# Patient Record
Sex: Female | Born: 1940 | Race: White | Hispanic: No | Marital: Married | Smoking: Current some day smoker
Health system: Southern US, Community
[De-identification: ages and names within clinical notes are randomized; demographics above are authoritative.]

## PROBLEM LIST (undated history)

## (undated) DIAGNOSIS — I2510 Atherosclerotic heart disease of native coronary artery without angina pectoris: Secondary | ICD-10-CM

## (undated) DIAGNOSIS — C801 Malignant (primary) neoplasm, unspecified: Secondary | ICD-10-CM

## (undated) DIAGNOSIS — I219 Acute myocardial infarction, unspecified: Secondary | ICD-10-CM

## (undated) DIAGNOSIS — I10 Essential (primary) hypertension: Secondary | ICD-10-CM

## (undated) DIAGNOSIS — J069 Acute upper respiratory infection, unspecified: Secondary | ICD-10-CM

## (undated) DIAGNOSIS — D649 Anemia, unspecified: Secondary | ICD-10-CM

## (undated) DIAGNOSIS — R011 Cardiac murmur, unspecified: Secondary | ICD-10-CM

## (undated) DIAGNOSIS — I6529 Occlusion and stenosis of unspecified carotid artery: Secondary | ICD-10-CM

## (undated) HISTORY — DX: Anemia, unspecified: D64.9

## (undated) HISTORY — DX: Occlusion and stenosis of unspecified carotid artery: I65.29

## (undated) HISTORY — DX: Atherosclerotic heart disease of native coronary artery without angina pectoris: I25.10

## (undated) HISTORY — PX: CAROTID ENDARTERECTOMY: SUR193

## (undated) HISTORY — PX: EYE SURGERY: SHX253

## (undated) HISTORY — DX: Acute myocardial infarction, unspecified: I21.9

## (undated) HISTORY — DX: Malignant (primary) neoplasm, unspecified: C80.1

---

## 1984-06-19 HISTORY — PX: ABDOMINAL HYSTERECTOMY: SHX81

## 1988-06-19 HISTORY — PX: FRACTURE SURGERY: SHX138

## 2000-06-19 HISTORY — PX: JOINT REPLACEMENT: SHX530

## 2005-06-19 DIAGNOSIS — I219 Acute myocardial infarction, unspecified: Secondary | ICD-10-CM

## 2005-06-19 HISTORY — PX: OTHER SURGICAL HISTORY: SHX169

## 2005-06-19 HISTORY — DX: Acute myocardial infarction, unspecified: I21.9

## 2005-09-15 ENCOUNTER — Inpatient Hospital Stay (HOSPITAL_COMMUNITY): Admission: EM | Admit: 2005-09-15 | Discharge: 2005-09-21 | Payer: Self-pay

## 2005-10-31 ENCOUNTER — Ambulatory Visit (HOSPITAL_COMMUNITY): Admission: RE | Admit: 2005-10-31 | Discharge: 2005-11-01 | Payer: Self-pay | Admitting: Cardiology

## 2006-01-22 ENCOUNTER — Ambulatory Visit (HOSPITAL_COMMUNITY): Admission: RE | Admit: 2006-01-22 | Discharge: 2006-01-22 | Payer: Self-pay | Admitting: Cardiology

## 2006-03-01 ENCOUNTER — Inpatient Hospital Stay (HOSPITAL_BASED_OUTPATIENT_CLINIC_OR_DEPARTMENT_OTHER): Admission: RE | Admit: 2006-03-01 | Discharge: 2006-03-01 | Payer: Self-pay | Admitting: Cardiology

## 2006-09-04 ENCOUNTER — Encounter (HOSPITAL_COMMUNITY): Admission: RE | Admit: 2006-09-04 | Discharge: 2006-09-04 | Payer: Self-pay | Admitting: Cardiology

## 2006-09-20 ENCOUNTER — Inpatient Hospital Stay (HOSPITAL_BASED_OUTPATIENT_CLINIC_OR_DEPARTMENT_OTHER): Admission: RE | Admit: 2006-09-20 | Discharge: 2006-09-20 | Payer: Self-pay | Admitting: Cardiology

## 2006-10-11 ENCOUNTER — Ambulatory Visit (HOSPITAL_COMMUNITY): Admission: RE | Admit: 2006-10-11 | Discharge: 2006-10-12 | Payer: Self-pay | Admitting: Cardiology

## 2006-10-24 ENCOUNTER — Encounter: Admission: RE | Admit: 2006-10-24 | Discharge: 2006-10-24 | Payer: Self-pay | Admitting: Family Medicine

## 2008-10-12 ENCOUNTER — Encounter (HOSPITAL_COMMUNITY): Admission: RE | Admit: 2008-10-12 | Discharge: 2009-01-10 | Payer: Self-pay | Admitting: Cardiology

## 2009-11-19 ENCOUNTER — Encounter (HOSPITAL_COMMUNITY): Admission: RE | Admit: 2009-11-19 | Discharge: 2010-01-18 | Payer: Self-pay | Admitting: Cardiology

## 2010-11-04 NOTE — Cardiovascular Report (Signed)
NAMECABRINI, RUGGIERI                ACCOUNT NO.:  [id number]   MEDICAL RECORD NO.:  [id number]          PATIENT TYPE:  OIB   LOCATION:  1966                         FACILITY:  [facility]   PHYSICIAN:  Mohan N. Sharyn Lull, M.D. DATE OF BIRTH:  November 24, 1940   DATE OF PROCEDURE:  09/20/2006  DATE OF DISCHARGE:                            CARDIAC CATHETERIZATION   PROCEDURE:  Left cardiac cath with selective left and right coronary  angiography, LV graft via right groin using Judkins technique.   INDICATIONS FOR PROCEDURE:  Ms. [name] is a 70 year old white  female with past medical history significant for posterolateral wall MI  in March 2007.  Subsequently, had emergency PTCA stenting to the left  circumflex.  Was noted to have 2-vessel disease, status post PCI to RCA  in May 2007, hypertension, hypercholesteremia, history of nonsustained  VT, non-insulin-dependent diabetes mellitus, tobacco abuse, degenerative  joint disease, positive family history of coronary artery disease.  Complains of exertional chest pain radiating to the neck without  associated nausea, vomiting or diaphoresis.  Denies shortness of breath.  Denies palpitation, lightheadedness or syncope.  Patient underwent  Persantine Myoview on March 18 which showed moderate-sized reversible  ischemia in the lateral wall with EF of 50%.   PAST MEDICAL HISTORY:  As above.   PAST SURGICAL HISTORY:  She had bilateral hip replacement in the past;  had right ankle surgery in the past.   NO KNOWN DRUG ALLERGIES.   SOCIAL HISTORY:  She is married, smoked 1-1/2 to 2 packs per day for 40+  years.  Now smokes less than half a pack per day and is planning to  quit.  No history of alcohol abuse.  She is retired.   FAMILY HISTORY:  Positive for coronary artery disease.   MEDICATIONS:  At home, she is on enteric coated aspirin 81 mg p.o.  daily, Plavix 75 mg p.o. daily, Toprol 100 mg p.o. daily, Altace 10 mg  p.o. daily, Azor 5/20 p.o.  daily, Imdur 60 mg p.o. daily, Lipitor 40 mg  p.o. daily, Protonix 40 mg p.o. daily.   GENERAL:  On examination she is alert, awake, oriented x3, in no acute  distress.  VITAL SIGNS:  Blood pressure is 160/82, pulse was 72 regular.  HEENT:  Conjunctivae were pink.  NECK:  Supple.  No JVD, no bruit.  LUNGS:  Clear to auscultation without rhonchi or rales.  CARDIOVASCULAR EXAM:  S1, S2 were normal.  There was a soft systolic  murmur.  ABDOMEN:  Soft.  Bowel sounds are present, nontender.  EXTREMITIES:  There is no clubbing, cyanosis or edema.   IMPRESSION:  New onset angina; positive Persantine Myoview, rule out  restenosis versus progression of the disease; coronary artery disease;  history of posterolateral wall myocardial infarction, status post  percutaneous coronary intervention to left circumflex, obtuse marginal  one and right coronary artery in the past as above; hypertension;  hypercholesteremia; chronic obstructive pulmonary disease; tobacco  abuse; positive family history of coronary artery disease.  Discussed  with the patient regarding Persantine Myoview result and left cath; its  risks and  benefits i.e. death, myocardial infarction, stroke, local  vascular complications, etcetera and consented for the procedure.   PROCEDURE:  After obtaining the informed consent, patient was brought to  the cath lab and was placed on fluoroscopy table.  The right groin was  prepped and draped in the usual fashion.  2% Xylocaine was used for  local anesthesia in the right groin.  With the help of thin-walled  needle, 4-French arterial sheath was placed.  The sheath was aspirated  and flushed.  Next, 4-French left Judkins catheter was advanced over the  wire under fluoroscopic guidance up to the ascending aorta.  Wire was  pulled out, the catheter was aspirated and connected to the manifold.  Catheter was further advanced and engaged into left coronary ostium.  Multiple views of the left  system were taken.  Next, the catheter was  disengaged and was pulled out over the wire and was replaced with 4-  [place] right Judkins catheter which was advanced over the wire under  fluoroscopic guidance into the ascending aorta.  Wire was pulled out.  The catheter was aspirated and connected to the manifold.  Catheter was  further advanced and engaged into the right coronary ostium.  Multiple  views of the right system were taken.  Next, catheter was disengaged and  was pulled out over the wire and was replaced with 6-French pigtail  catheter which was advanced over the wire under fluoroscopic guidance up  to the ascending aorta.  Wire was pulled out the catheter was aspirated  and connected to the manifold.  Catheter was further advanced across the  aortic valve into the LV.  LV pressures were recorded.  Next, LV graft  was done in 30 degrees RAO position.  Post angiographic pressures were  recorded from LV and then pullback pressures were recorded from the  aorta.  There was no gradient across the aortic valve.  Next, the  pigtail catheter was pulled out over the wire.  Sheaths were aspirated  and flushed.   FINDINGS:  LV showed good LV systolic function, EF of 50-55%.  Left main  was short which was patent.  LAD has 10-15% proximal and 20-25% mid  stenosis.  Diagonal one has 30-40% ostial stenosis.  Left circumflex has  proximal 75-80% focal stenosis at the distal edge of the stent.  OM1 was  moderate size which has ostial 30-40% stenosis which appears to have  hypolucency at the ostium, but TIMI grade 3 distal flow.  OM1 branches  into superior smaller branch and larger inferior branch, superior branch  has 40-50% ostial stenosis.  OM2 was very small which was patent.  RCA  has 30-40% proximal stenosis.  Stented mid portion is patent.  PDA is  small which is patent.  The LV branches are very small and diffusely diseased.  The patient tolerated procedure well.  There were no   complications.  The patient was transferred to recovery room in stable  condition.  The patient will be scheduled for PCI to left circumflex  early next week.           ______________________________  Eduardo Osier. Sharyn Lull, M.D.     M[facility]/MEDQ  D:  09/20/2006  T:  09/20/2006  Job:  [id number]   cc:   Cath Lab

## 2010-11-04 NOTE — Cardiovascular Report (Signed)
NAMESHARVI, MOONEYHAN                ACCOUNT NO.:  [id number]   MEDICAL RECORD NO.:  [id number]          PATIENT TYPE:  OIB   LOCATION:  6522                         FACILITY:  [facility]   PHYSICIAN:  Mohan N. [name], M.D. DATE OF BIRTH:  1940/09/18   DATE OF PROCEDURE:  10/11/2006  DATE OF DISCHARGE:                            CARDIAC CATHETERIZATION   PROCEDURE:  Successful PTCA to proximal left circumflex done using 3.0 x  8-mm long Voyager balloon.   INDICATIONS FOR PROCEDURE:  [name] is a 70 year old white female  with past medical history significant for coronary artery disease,  history of posterolateral wall MI in March 2007, status post emergency  PCI to left circumflex, status post PTCA stenting to RCA in May 2007,  hypertension, hypercholesteremia, history of nonsustained VT, non-  insulin-dependent diabetes mellitus controlled by diet, tobacco abuse,  degenerative joint disease, positive family history of coronary artery  disease.  Complains of exertional chest pain radiating to the neck  without associated symptoms of nausea, vomiting or diaphoresis.  Denies  any shortness of breath.  Denies palpitation, lightheadedness or  syncope.  The patient underwent Persantine Myoview on March 18 which  showed moderate size reversible ischemia in the lateral wall with EF of  50%.  The patient subsequently underwent left cath as outpatient which  showed tight stenosis at the distal edge of the stent in proximal left  circumflex.  The patient is scheduled for elective PCI to left  circumflex.   PROCEDURE:  After obtaining informed consent, the patient was brought to  the cath lab and was placed on fluoroscopy table.  The right groin was  prepped and draped in usual fashion, and 2% Xylocaine was used for local  anesthesia in the right groin.  With the help of thin-wall needle, a 6-  French arterial sheath was placed.  The sheath was aspirated and  flushed.  Next, a 6-French left  Judkins catheter was advanced over the  wire under fluoroscopic guidance up to the ascending aorta.  Wire was  pulled out.  The catheter was aspirated and connected to the manifold.  A 6-French 3.5 XB guiding catheter was advanced over the wire under  fluoroscopic guidance up to the ascending aorta.  Wire was pulled out.  The catheter was aspirated and connected to the manifold.  Catheter was  further advanced and engaged into left coronary ostium.  Multiple views  of the left system were obtained.   FINDINGS:  Again, proximal circumflex at the distal edge of the stent  showed focal 75-80% stenosis.   INTERVENTIONAL PROCEDURE:  Successful PTCA to proximal left circumflex  was done using 3.0 x 8-mm long Voyager balloon.  Two inflations were  done going up to 11 atmospheres of pressure.  Lesion was dilated from 35  to 80% less, and 10% __________ with excellent TIMI grade 3 distal flow  without evidence of dissection or distal embolization.  The patient  received weight-based Angiomax and 300 mg of Plavix during the  procedure.  The patient tolerated procedure well.  There were no  complications.  The patient  was transferred to recovery room in stable  condition.           ______________________________  Eduardo Osier [name], M.D.    MNH/MEDQ  D:  10/12/2006  T:  10/12/2006  Job:  [id number]   cc:   Catheterization Laboratory

## 2010-11-04 NOTE — Discharge Summary (Signed)
NAMELEASA, KINCANNON                ACCOUNT NO.:  [id number]   MEDICAL RECORD NO.:  [id number]          PATIENT TYPE:  OIB   LOCATION:  6522                         FACILITY:  [facility]   PHYSICIAN:  Mohan N. Sharyn Lull, M.D. DATE OF BIRTH:  Mar 11, 1941   DATE OF ADMISSION:  10/11/2006  DATE OF DISCHARGE:  10/12/2006                               DISCHARGE SUMMARY   ADMITTING DIAGNOSES:  1. New-onset angina.  2. Positive Persantine Myoview status post left cath.  3. CAD.  4. History of posterolateral wall MI in the past.  5. Multivessel coronary artery disease.  6. Hypertension.  7. Hypercholesteremia.  8. COPD.  9. Tobacco abuse.  10.Positive family history of coronary artery disease.   FINAL DIAGNOSES:  1. New onset angina status post PTCA to proximal left circumflex.  2. Coronary artery disease.  3. History of posterolateral wall MI.  4. Status post PCI to left circumflex OM in past.  5. Multivessel coronary artery disease status post PTCA stenting to      RCA in the past.  6. Hypertension.  7. Hypercholesteremia.  8. COPD/bronchitis.  9. Tobacco abuse.  10.Positive family history of coronary artery disease   DISCHARGE MEDICATIONS:  1. Enteric-coated aspirin 325 mg one tablet daily.  2. Plavix 75 mg one tablet daily with food.  3. Toprol XL 50 mg one tablet daily.  4. Altace 10 mg one capsule daily.  5. Azor 5/20 one tablet daily.  6. Lipitor 40 mg one tablet daily.  7. Protonix 40 mg one tablet daily.  8. Nitrostat 0.4 mg sublingual used as directed.  9. Z-Pak 250 mg 2 tablets today, then one tablet daily for four more      days.  10.Chantix 1 mg half tablet daily for 4 days, then half tablet twice      daily for 4 days, then one tablet twice daily.   DISCHARGE INSTRUCTIONS:  1. Diet:  Low salt, low cholesterol.  At this time, the patient has      been advised to avoid sweets.  2. Post PTCA and stent instructions have been given.  3. The patient has been advised to  avoid lifting, driving or sexual      activity for one week.   CONDITION AT DISCHARGE:  Stable.   BRIEF HISTORY AND HOSPITAL COURSE:  [name] is a 70 year old white  female with past medical history significant for coronary artery  disease, history of posterolateral wall MI in March 2007, status post  emergent PCI to left circumflex, status post PCI to RCA in __________ ,  hypertension, hypercholesterolemia, history of nonsustained VT, non-  insulin-dependent diabetes mellitus controlled by diet, tobacco abuse,  degenerative joint disease, positive family history of coronary artery  disease.  Complains of exertional chest pain radiating to the neck  without associated nausea, vomiting or diaphoresis.  Denies any  shortness of breath.  Denies palpitation, lightheadedness or syncope.  The patient underwent Persantine Myoview on September 04, 2006, which showed  moderate size reversible ischemia in the lateral wall with EF of 50%.  The patient subsequently had left  cath which showed tight stenosis in  the distal edge of the stent and left circumflex.  The patient is  admitted here for elective PCI to left circumflex.   PAST MEDICAL HISTORY:  As above.   SURGICAL HISTORY:  1. She has bilateral hip replacement in the past.  2. Had right ankle surgery in the past.   ALLERGIES:  NO KNOWN DRUG ALLERGIES   SOCIAL HISTORY:  She is married, smoked one and a half to two packs per  day for 40 years and now has a half pack per day.  No history of alcohol  abuse.  She is retired.  The patient intends to stop smoking now.   FAMILY HISTORY:  Positive for coronary artery disease.   MEDICATION AT HOME:  1. Enteric-coated aspirin 81 mg p.o. daily.  2. Plavix 75 mg p.o. daily.  3. Toprol XL 100 mg p.o. daily.  4. Altace 10 mg p.o. daily.  5. Azor 5/20 p.o. daily.  6. Imdur 60 p.o. daily.  7. Lipitor 40 mg p.o. daily.  8. Protonix 40 mg p.o. daily .   PHYSICAL EXAMINATION:  GENERAL:  She is  alert and oriented x3 in no  acute distress.  '  VITAL SIGNS:  Blood pressure was 160/82, pulse was 72 regular.  HEENT:  Conjunctivae was pink.  NECK:  Supple, no JVD, no bruits.  LUNGS:  Clear to auscultation without rhonchi or rales.  CARDIOVASCULAR:  S1, S2 normal.  No S3 gallop.  ABDOMEN:  Soft.  Bowel sounds were present, nontender.  EXTREMITIES:  There is no clubbing, cyanosis or edema.   BRIEF HOSPITAL COURSE:  The patient was a.m. admit and underwent PTCA to  left supple proximal left circumflex as per procedure report.  The  patient tolerated procedure well.  There are no complications.  Postprocedure, the patient did not have any chest pain during the  hospital stay.  The labs remained stable.  CPK was  48, MB 1.8.  Her renal function is stable.  The patient has been placed  on cardiac rehab as well.  The patient has been ambulating in hallway  without any problems.  Groin is stable with no evidence of hematoma or  bruit.  The patient will be discharged home on above medications and  will be followed up in my office in one week.           ______________________________  Eduardo Osier. Sharyn Lull, M.D.     M[facility]/MEDQ  D:  10/12/2006  T:  10/12/2006  Job:  [id number]

## 2010-11-04 NOTE — Cardiovascular Report (Signed)
NAMEMARGART, Mcclure                ACCOUNT NO.:  [id number]   MEDICAL RECORD NO.:  [id number]          PATIENT TYPE:  OIB   LOCATION:  6526                         FACILITY:  [facility]   PHYSICIAN:  [name], M.D. DATE OF BIRTH:  December 26, 1940   DATE OF PROCEDURE:  10/31/2005  DATE OF DISCHARGE:  11/01/2005                              CARDIAC CATHETERIZATION   PROCEDURE:  1.  Left cardiac cath with selective left and right coronary angiography via      right groin using Judkins technique.  2.  Successful percutaneous transluminal coronary angiography to mid and      distal junction of right coronary artery using 2.5 x 12-mm long Voyager      balloon.  3.  Successful deployment of 2.75 x 33-mm long Cypher drug-eluting stent in      mid and distal junction of right coronary artery.  4.  Successful post dilatation of this stent using 2.75 x 13-mm long      PowerSail balloon.   INDICATIONS FOR PROCEDURE:  [name] is a 70 year old white female with  past medical history significant for coronary artery disease status post  posterolateral wall myocardial infarction in March 2007, two-vessel coronary  artery disease.  She had PCI to the left circumflex in March 2007 when she  presented with posterolateral wall MI, hypertension, hypercholesteremia,  tobacco abuse, complains of retrosternal chest pressure and tightness  lasting 2-3 minutes relieved with rest without associated symptoms of  nausea, vomiting or diaphoresis.  She states her activity is limited post  myocardial infarction.  She denies any palpitation, light headedness or  syncope.  Denies PND, orthopnea or leg swelling.  Denies cough, fever or  chills.  The patient was noted to have sequential mid and distal stenosis in  the range of 60-80% in the RCA when she had posterolateral wall MI requiring  PCI to the left circumflex system.  The patient is admitted for left cath  for re-look angiography and possibly PTCA and  stenting to RCA.   PROCEDURE:  After obtaining informed consent, the patient was brought to the  cath lab and was placed on the fluoroscopy table.  The right groin was  prepped and draped in usual fashion.  2% Xylocaine was used for local  anesthesia in the right groin. With the help of thin-wall needle, a 7-French  arterial sheath was placed.  The sheath was aspirated and flushed.  Next, 7-  French left Judkins catheter was advanced over the wire under fluoroscopic  guidance up to the ascending aorta.  The wire was pulled out, the catheter  was aspirated, and connected to the manifold.  The catheter was further  advanced and engaged into the left coronary ostium.  Multiple views of the  left system were obtained.  Next, the catheter was disengaged and was pulled  out over the wire and was replaced with 7-French JR-4 guiding catheter which  was advanced over the wire under fluoroscopic guidance into the ascending  aorta.  The wire was pulled out, the catheter was aspirated, and connected  to the manifold.  The catheter was further advanced and engaged into right  coronary ostium.  Multiple views of the right system were taken.   FINDINGS:  LV was not done.  Left main was short which was patent.  Left  circumflex was patent at prior PTCA stented site.  LAD has mild disease as  before.  RCA has 20-30% proximal stenosis, 85% mid stenosis, and 70% mid and  distal junction stenosis and 50-60% distal stenosis.   INTERVENTIONAL PROCEDURE:  Successful PTCA to mid and distal junction of the  RCA was done using 2.5 x 12-mm long Voyager balloon for predilatation and  then 2.75 x 33-mm long Cypher drug-eluting stent was deployed in mid and  distal junction of RCA at 13 atmospheres pressure, the stent was postdilated  using 2.75 x 13-mm long PowerSail balloon going up to 18 atmospheres of  pressure.  The lesions were dilated from 70-85% to 0% with  excellent TIMI grade 3 distal flow without evidence  of dissection or distal  embolization.  The patient received weight-based Angiomax and 300 mg of  Plavix prior to the procedure.  The patient tolerated procedure well.  There  are no complications.  The patient was transferred to the recovery room in  stable condition.  Oct 31, 2005           ______________________________  Eduardo Osier. Sharyn Mcclure, M.D.     M[facility]/MEDQ  D:  11/01/2005  T:  11/01/2005  Job:  [id number]

## 2010-11-04 NOTE — Discharge Summary (Signed)
[name], [name]                ACCOUNT NO.:  [id number]   MEDICAL RECORD NO.:  [id number]          PATIENT TYPE:  INP   LOCATION:  2011                         FACILITY:  [facility]   PHYSICIAN:  Eduardo Osier. Sharyn Lull, M.D. DATE OF BIRTH:  Aug 24, 1940   DATE OF ADMISSION:  09/15/2005  DATE OF DISCHARGE:  09/21/2005                                 DISCHARGE SUMMARY   ADMITTING DIAGNOSES:  1.  Acute posterolateral wall myocardial infarction status post ventricular      fibrillation, cardiac arrest in emergency room.  2.  Hypertension.  3.  Tobacco abuse.  4.  Positive family history of coronary artery disease.  5.  Chronic anemia.  6.  Degenerative joint disease.   DISCHARGE DIAGNOSES:  1.  Status post acute posterolateral wall myocardial infarction.  2.  Status post ventricular fibrillation cardiac arrest in emergency room.  3.  Status post emergency PTCA and stenting to left circumflex as per      procedure report.  4.  Two vessel coronary artery disease status post non-sustained ventricular      tachycardia and supraventricular tachycardia.  5.  Hypertension.  6.  Non-insulin-dependent diabetes mellitus controlled by diet.  7.  Hypercholesterolemia.  8.  Status post acute on chronic anemia.  9.  History of tobacco abuse.  10. Positive family history of coronary artery disease.  11. Status post bronchitis.  12. Degenerative joint disease.   DISCHARGE HOME MEDICATIONS:  1.  Enteric-coated aspirin 81 mg two tablets daily.  2.  Plavix 75 mg one tablet daily with food.  3.  Toprol XL 50 mg one tablet daily.  4.  Altace 2.5 mg one capsule daily.  5.  Lipitor 20 mg one tablet daily.  6.  Protonix 40 mg one tablet daily.  7.  Nitrostat 0.4 mg sublingual use as directed.  8.  Feosol 325 mg one tablet daily.   DIET:  Low salt, low cholesterol.  Patient has been advised to avoid sweets,  increase activity slowly as tolerated.  Post PTCA/stent instructions have  been given.  Patient has  been advised to avoid any lifting, pushing, or  pulling.   Follow up with me in one week.   CONDITION ON DISCHARGE:  Stable.   BRIEF HISTORY AND HOSPITAL COURSE:  [name] is a 70 year old white female  with past medical history significant for hypertension, tobacco abuse,  degenerative joint disease, strong family history of coronary artery  disease.  She came to the ER via EMS complaining of retrosternal chest pain  off and on yesterday evening and around 12:30 a.m. developed severe chest  pain associated with nausea, vomiting, and diaphoresis.  She called EMS and  was brought to the ER.  EKs done in the ER showed atrial fibrillation with  controlled ventricular response with marked ST depression in V1-V4 and ST  elevation in V5 and V6 suggestive of posterolateral wall injury pattern.  Patient went into ventricular fibrillation arrest in the ER requiring CPR  and defibrillation with conversion to sinus rhythm.  States that similar  episode of chest pain lasting few  minutes approximately two months ago and  had stress test which was normal.   PAST MEDICAL HISTORY:  As above.   PAST SURGICAL HISTORY:  She had bilateral hip surgery in the past and right  ankle surgery in the past.   ALLERGIES:  No known drug allergies.   HOME MEDICATIONS:  She was on questionable blood pressure medications.   SOCIAL HISTORY:  She is married.  Smoke one and a half packs to two packs  per day for 40+ years.  No history of alcohol abuse.  She is retired.   FAMILY HISTORY:  Positive for coronary artery disease.   PHYSICAL EXAMINATION:  GENERAL:  She is alert, awake, oriented x3 in no  acute distress complaining of severe chest pain.  Hemodynamically was  stable.  HEENT:  Conjunctiva was pink.  NECK:  Supple.  No JVD.  LUNGS:  Clear to auscultation without rhonchi or rales.  CARDIOVASCULAR:  S1, S2 was normal.  There was soft systolic murmur and S4  gallop.  ABDOMEN:  Soft.  Bowel sounds  present, nontender.  EXTREMITIES:  There is no clubbing, cyanosis, edema.   LABORATORIES:  Her cholesterol was 272, LDL 174, triglycerides 329.  Her CK  was 75, MB 3.5.  Second set CK 4918, MB 376.  Third set CK 3451, MB 256.  Fourth set CK 2614, MB 139.  Relative index 5.3.  Fourth set CK 206, MB 4.3.  A troponin I was 0.18, more than 100, then 26.52.  Repeat troponin I today  is 8.53.  Her CK is 125, MB 3.3.  Her sodium was 133, potassium 3.2,  chloride 102, bicarbonate 22, glucose 145, BUN 21, creatinine 1.2.  Today's  electrolyte is BUN is 10, creatinine 0.8, potassium is 4.1.  Hemoglobin A1c  was 5.7.  Her admission hemoglobin was 11.4, hematocrit 34, white count of  18.  On March 31 hemoglobin dropped to 9.4, hematocrit 27.8, white count of  9.4.  Patient did receive 2 units of packed rbc's and repeat hemoglobin on  April 4 is 11.6, hematocrit 33.5, white count of 8.2.  Today hemoglobin is  8.5, hematocrit 32.9, white count of 6.9 which has been stable.   BRIEF HISTORY AND HOSPITAL COURSE:  Patient was directly taken to the  catheterization laboratory and underwent emergency left catheterization and  PTCA stenting to left circumflex, 100% occluded circumflex.  Patient  tolerated procedure well.  There were no complications.  Patient was  transferred to CCU in stable condition.  Patient did not have any episodes  of further chest pain during the hospital stay.  Patient's groin is stable  with no evidence of hematoma or bruit.  Patient has been ambulating in  hallway without any problems.  Patient had episode of nonsustained SVT.  Her  beta blockers were increased.  Patient did not have any further episodes of  SVT during the hospital stay.  Patient had cough with mild yellowish  productive cough.  Was started on Augmentin with  improvement in her symptoms.  Patient remained afebrile during the hospital stay.  Patient will be discharged home on above medications and will be  followed  up in my office in one week.  Patient will require PCI to RCA which  we will schedule in a few weeks.           ______________________________  Eduardo Osier Sharyn Lull, M.D.     MNH/MEDQ  D:  09/21/2005  T:  09/22/2005  Job:  [id number]

## 2010-11-04 NOTE — Discharge Summary (Signed)
NAMESUANN, KLIER                ACCOUNT NO.:  [id number]   MEDICAL RECORD NO.:  [id number]          PATIENT TYPE:  OIB   LOCATION:  6526                         FACILITY:  [facility]   PHYSICIAN:  Mohan N. Sharyn Lull, M.D. DATE OF BIRTH:  Aug 12, 1940   DATE OF ADMISSION:  10/31/2005  DATE OF DISCHARGE:  11/01/2005                                 DISCHARGE SUMMARY   ADMITTING DIAGNOSES:  1.  New-onset angina.  2.  Coronary artery disease status post posterolateral wall myocardial      infarction in March 2007.  Two-vessel coronary artery disease.  3.  Hypertension.  4.  Hypercholesteremia.  5.  Tobacco abuse.  6.  Positive family history of coronary artery disease.   DISCHARGE DIAGNOSES:  1.  New onset angina status post percutaneous transluminal coronary      angioplasty stenting to right coronary artery.  2.  Coronary artery disease status post posterolateral wall myocardial      infarction in March 2007.  Two-vessel coronary artery disease.  3.  Hypertension.  4.  Hypercholesteremia.  5.  Anemia.  6.  Tobacco abuse.  7.  Positive family history of coronary artery disease.   DISCHARGE HOME MEDICATIONS:  1.  Enteric-coated aspirin 81 mg two tablets daily.  2.  Plavix 75 mg one tablet daily with food.  3.  Toprol XL 50 mg one tablet daily.  4.  Altace 5 mg one capsule daily.  5.  Vytorin 10/40 one tablet daily.  6.  Protonix 40 mg one tablet daily.  7.  Feosol one tablet daily.  8.  Nitrostat 0.4 mg sublingual use as directed.   RESTRICTIONS:  Diet low salt, low cholesterol.  Post PTCA stent instructions  have been given.  The patient has been advised to avoid any lifting, pushing  or pulling for 48 hours.  Follow up with me in 1 week.  The patient is  scheduled for phase II cardiac rehab as outpatient.  Condition at discharge  is stable.   BRIEF HISTORY AND HOSPITAL COURSE:  Ms. Imel is a 70 year old white female  with past medical history significant for coronary artery  disease status  post posterolateral wall myocardial infarction, two-vessel coronary artery  disease status post PCI to left circumflex in March 2007, hypertension and  hypercholesteremia complains of retrosternal chest pressure/tightness  lasting 2-3 minutes relieved with rest without associated symptoms of  nausea, vomiting or diaphoresis.  States her activity level is limited post  MI.  Denies any palpitation, lightheadedness or syncope.  Denies PND,  orthopnea, leg swelling.  Denies cough, fever or chills.  The patient was  noted to have sequential mid RCA lesion in the range of 60-80%.  The patient  was admitted for PCI to RCA.   PAST MEDICAL HISTORY:  As above.   PAST SURGICAL HISTORY:  She had bilateral hip replacement in the past status  post right ankle surgery in the past.   ALLERGIES:  NO KNOWN DRUG ALLERGIES.   SOCIAL HISTORY:  She is married.  Smoked one and a half to two packs per day  for 40+ years.  No history of alcohol abuse.  She is retired.   FAMILY HISTORY:  Positive for coronary artery disease.   MEDICATIONS AT HOME:  She was on enteric-coated aspirin 81 mg p.o. daily,  Plavix 75 mg p.o. daily, Toprol 50 p.o. daily, Altace 5 p.o. daily, Vytorin  10/40 p.o. daily, Protonix 40 p.o. daily, Feosol 325 mg p.o. daily.   EXAMINATION:  GENERAL:  She was alert, awake, oriented x3 in no acute  distress.  VITAL SIGNS:  Blood pressure was 150/80, pulse was 70 regular.  HEENT:  Conjunctivae pink.  NECK:  Supple, no JVD, no bruit.  LUNGS:  Clear to auscultation without rhonchi or rales.  CARDIOVASCULAR EXAM:  S1-S2 was normal.  There was soft systolic murmur.  EXTREMITIES:  There is no clubbing, cyanosis or edema.   BRIEF HOSPITAL COURSE:  The patient was AM admit and underwent left cardiac  cath with selective left and right coronary angiography and PTCA and  stenting to RCA as per procedure report.  The patient tolerated procedure  well.  There were no complications.   Phase I cardiac rehab was called.  The  patient has been ambulating in hallway without any problems.  Her groin is  stable.  There is no evidence of hematoma or bruit.  Postprocedure her EKG  has been stable and her labs postprocedure have been stable including CPK-  MB.  The patient did not have any episodes of chest pain during the hospital  stay.  The patient will be discharged home on above medications and will be  followed up in my office in 1 week.           ______________________________  Eduardo Osier. Sharyn Lull, M.D.     MNH/MEDQ  D:  11/01/2005  T:  11/01/2005  Job:  [id number]

## 2010-11-04 NOTE — Cardiovascular Report (Signed)
NAMELORIANN, [name]                ACCOUNT NO.:  [id number]   MEDICAL RECORD NO.:  [id number]          PATIENT TYPE:  INP   LOCATION:  1829                         FACILITY:  [facility]   PHYSICIAN:  Mohan N. Sharyn Lull, M.D. DATE OF BIRTH:  [birth year]   DATE OF PROCEDURE:  09/15/2005  DATE OF DISCHARGE:                              CARDIAC CATHETERIZATION   PROCEDURE:  1.  Left cardiac cath with selective left and right coronary angiograph,      left ventriculogram, via the right groin using Judkins technique.  2.  Successful percutaneous transluminal coronary angiography to 100%      occluded proximal left circumflex using 3 x 8-mm long Voyager balloon.  3.  Successful deployment of 3 x 18-mm long Cypher drug eluting stent in      proximal and mid junction of left circumflex.   INDICATIONS FOR PROCEDURE:  [name] is a 70 year old white female with  past medical history significant for hypertension, tobacco abuse,  degenerative joint disease, strong family history of coronary artery  disease. She came to the ER via EMS complaining of retrosternal chest pain  off and on since yesterday evening, but did not seek medical attention and  around 12:30 a.m. developed severe chest pain associated with nausea,  vomiting and diaphoresis.  She called EMS and was brought to the ER.  EKG  done in the ER showed atrial fibrillation with controlled ventricular  response with ST depression in V1 to V4 and ST elevation in V5-V6 suggestive  of posterolateral wall injury pattern.  The patient went into V-fib arrest  in the ER requiring CPR and defibrillation with conversion to sinus rhythm.  The patient states he had a similar episode of chest pain lasting a few  minutes approximately two months ago and had stress test which was normal.  Due to typical changes of acute posterolateral wall MI, cardiac arrest, and  multiple risk factors, I discussed with the patient and family regarding  emergency left cath,  possible PTCA and stenting, its risks and benefits,  i.e. death, MI, stroke, need for emergency CABG, risks of restenosis, local  vascular complications, they accept and consented for the procedure.   PROCEDURE:  After obtaining informed consent, the patient was brought to the  cath lab and was placed on fluoroscopy table.  The right groin was prepped  and draped in usual fashion.  2% Xylocaine was used for local anesthesia in  the right groin.  With the help of a thin-walled needle, 7-French arterial  sheath was placed.  Sheath was aspirated and flushed.  Next, 6-French left  Judkins catheter was advanced over the wire under fluoroscopic guidance up  into the ascending aorta.  The wire was pulled out, the catheter was  aspirated and connected to the manifold.  The catheter was further advanced  and engaged into left coronary ostium.  Multiple views of the left system  were taken.  Next, the catheter was disengaged and was pulled out over the  wire was replaced with 6-French right Judkins catheter which was advanced  over the wire under fluoroscopic  guidance to the ascending aorta.  The wire  was pulled out, the catheter was aspirated, and connected to the manifold.  The catheter was further advanced and engaged into right coronary ostium.  A  single view of right coronary artery was obtained.  Next, the catheter was  disengaged and was pulled out and was replaced with 6-French pigtail  catheter at the end of the procedure over the wire, catheter was advanced  over the wire up to the ascending aorta.  Catheter was further advanced  across aortic valve into the LV and LV pressures were recorded.  Next, left  ventriculogram was done in 30 degrees RAO position.  Post angiographic  pressures were recorded from LV and then pullback pressures were recorded  from the aorta, there was no significant gradient across aortic valve.  Next, the pigtail catheter was pulled out over the wire.  Sheaths  aspirated  and flushed.   FINDINGS:  LV showed inferoapical wall hypokinesia, EF of 45-50%.  Left main was short which was patent.  LAD has 15-20% proximal and 30-40% mid bifurcation stenosis with diagonal  one.  Diagonal one has 40-50% ostial stenosis.  Left circumflex was 100% occluded proximally with TIMI 0 distal flow.  RCA has 75-80% mid sequential stenosis and 60-70% distal stenosis distally  vessel, the vessel is small and diffusely diseased.  The PDA is small which  was patent.   INTERVENTIONAL PROCEDURE:  Successful PTCA to proximal left circumflex was  done using 3 x 8-mm long Voyager balloon for predilatation, using double  wire technique and then 3 x 18-mm long Cypher drug eluting stent was  deployed at 13 atmospheres pressure in proximal and mid junction of left  circumflex.  The stent was postdilated using 3 x 8-mm long PowerSail balloon  going up to 16-18 atmospheres of pressure using step-up and step-down  technique.  The lesion was dilated from 100% to 0% residual with excellent  TIMI III distal flow without evidence of dissection or distal embolization.  The patient received weight-based heparin, Integrilin, and total of 600 mg  of Plavix during the procedure.  The patient tolerated procedure well.  There are no complications.  The patient was transferred to the CCU in  stable condition.           ______________________________  Eduardo Osier Sharyn Lull, M.D.     MNH/MEDQ  D:  09/15/2005  T:  09/16/2005  Job:  [id number]

## 2010-11-04 NOTE — Cardiovascular Report (Signed)
[name], [name]                ACCOUNT NO.:  [id number]   MEDICAL RECORD NO.:  [id number]          PATIENT TYPE:  OIB   LOCATION:  1961                         FACILITY:  [facility]   PHYSICIAN:  [name], M.D. DATE OF BIRTH:  [birth year]   DATE OF PROCEDURE:  03/01/2006  DATE OF DISCHARGE:  03/01/2006                              CARDIAC CATHETERIZATION   PROCEDURE:  Left cardiac catheterization with selective left and right  coronary angiography, left ventriculography via right groin using Judkins  technique.   INDICATION FOR THE PROCEDURE:  [name] is a 70 year old white female with  past medical history significant for coronary artery disease status post  posterolateral wall MI; status post ventricular fibrillation cardiac arrest  in the ED; status post PCI to left circumflex; history of nonsustained VT;  two-vessel coronary artery disease, subsequently had PCI to RCA; history of  hypertension; non-insulin-dependent diabetes mellitus; hypercholesteremia;  tobacco abuse; degenerative joint disease; positive family history of  coronary artery disease.  She complains of occasional retrosternal chest  pain associated with shortness of breath off and on.  Also complains of  exertional dyspnea with minimal exertion.  Denies any nausea, vomiting,  diaphoresis.  Denies PND, orthopnea, leg swelling.  Denies palpitation,  lightheadedness or syncope.  The patient underwent Persantine Myoview on  January 22, 2006, which showed area of reversible ischemia in the lateral and  to a lesser extent in the inferior wall with EF of 48%.   PAST MEDICAL HISTORY:  As above.   PAST SURGICAL HISTORY:  She had bilateral hip replacement, had right ankle  surgery in the past.   ALLERGIES:  No known drug allergies.   MEDICATION:  At home she is on:  1. Enteric-coated aspirin 81 mg one p.o. daily.  2. Plavix 75 mg p.o. daily.  3. Toprol-XL 100 mg p.o. daily.  4. Altace 10 mg p.o. daily.  5.  Protonix 40 mg p.o. daily.   SOCIAL HISTORY:  She is married, smokes one-and-a-half to two packs per day  for 40+ years, quit post MI.  No history of alcohol abuse.  She is retired.   FAMILY HISTORY:  Positive for coronary artery disease.   EXAMINATION:  GENERAL:  She is alert, awake, oriented x3 in no acute  distress.  VITAL SIGNS:  Blood pressure was 160/90, pulse was 72, regular.  HEENT:  Conjunctivae were pink.  NECK:  Supple, no JVD.  LUNGS:  Clear to auscultation without rhonchi or rales.  CARDIOVASCULAR:  S1, S2 was normal.  There was a soft systolic murmur and S4  gallop.  ABDOMEN:  Soft.  Bowel sounds were present, nontender.  EXTREMITIES:  There is no clubbing, cyanosis or edema.   IMPRESSION:  1. New-onset angina, positive Persantine Myoview, rule out restenosis.  2. Uncontrolled hypertension.  3. Coronary artery disease.  4. History of posterolateral wall myocardial infarction status post      ventricular fibrillation arrest.  5. History of nonsustained ventricular tachycardia.  6. Hypercholesteremia.  7. History of tobacco abuse.  8. Positive family history of coronary artery disease.   Discussed  with the patient regarding left catheterization, its risks and  benefits, i.e. death, MI, stroke, need for emergency CABG, local vascular  complications, bleeding, infection.  Accepted and consented for the  procedure.   After obtaining the informed consent the patient was brought to the  catheterization laboratory and was placed on fluoroscopy table.  Right groin  was prepped and draped in usual fashion.  Xylocaine 2% was used for local  anesthesia in the right groin.  With the help of a thin-wall needle, 4-  French arterial sheath was placed.  The sheath was aspirated and flushed.  Next, 4-French left Judkins catheter was advanced over the wire under  fluoroscopic guidance up to the ascending aorta.  Wire was pulled out, the  catheter was aspirated and connected to the  manifold.  Catheter was further  advanced and engaged into left coronary ostium.  Multiple views of the left  system were taken.  Next, the catheter was disengaged and was pulled out  over the wire and was replaced with 4-French 3-D right diagnostic catheter,  which was advanced over the wire under fluoroscopic guidance up to the  ascending aorta.  Wire was pulled out, the catheter was aspirated and  connected to the manifold.  Catheter was further advanced and engaged into  right coronary ostium.  Multiple views of the right system were taken.  Next, the catheter was disengaged and was pulled out over the wire and was  replaced with 4-French pigtail catheter, which was advanced over the wire  under fluoroscopic guidance to the ascending aorta.  Wire was pulled out,  the catheter was aspirated and connected to the manifold.  Catheter was  further advanced across the aortic valve into the LV.  LV pressures were  recorded.  Next, left ventriculograph was done in 30-degrees RAO position.  Post angiographic pressures were recorded from LV and then pullback  pressures were recorded from aorta.  There was no gradient across the aortic  valve.  Next, the pigtail catheter was pulled out over the wire, sheaths  were aspirated and flushed.   FINDINGS:  LV showed good LV systolic function, EF of 50-55%.  Left main was  short which was patent.  LAD had stent with 15% proximal and mid stenosis.  Diagonal one was very, very small.  Diagonal two has 30-40% ostial stenosis.  Left circumflex is patent at prior PTCA and stented site and has 15-20% mid  stenosis.  OM-1 is very, very small.  OM-2 is large which is patent at prior  PTCA site.  OM-3 and OM-4 are very, very small.  RCA has 10-15% proximal and  20-30% mid stenosis.  Stented segment is patent.  PDA is patent.  PLV branch  is very small, which is patent.  The patient tolerated procedure well. There were no complications.  The patient was  transferred to recovery room  in stable condition.           ______________________________  Eduardo Osier Sharyn Mcclure, M.D.     MNH/MEDQ  D:  03/01/2006  T:  03/01/2006  Job:  [id number]   cc:   Cath Lab

## 2010-11-25 ENCOUNTER — Inpatient Hospital Stay (HOSPITAL_COMMUNITY)
Admission: EM | Admit: 2010-11-25 | Discharge: 2010-11-29 | DRG: 287 | Disposition: A | Discharge status: Home / Self Care | Payer: Medicare Other | Attending: Cardiology | Admitting: Cardiology

## 2010-11-25 DIAGNOSIS — J449 Chronic obstructive pulmonary disease, unspecified: Secondary | ICD-10-CM | POA: Diagnosis present

## 2010-11-25 DIAGNOSIS — J4489 Other specified chronic obstructive pulmonary disease: Secondary | ICD-10-CM | POA: Diagnosis present

## 2010-11-25 DIAGNOSIS — R079 Chest pain, unspecified: Principal | ICD-10-CM | POA: Diagnosis present

## 2010-11-25 DIAGNOSIS — I10 Essential (primary) hypertension: Secondary | ICD-10-CM | POA: Diagnosis present

## 2010-11-25 DIAGNOSIS — I252 Old myocardial infarction: Secondary | ICD-10-CM

## 2010-11-25 DIAGNOSIS — I2510 Atherosclerotic heart disease of native coronary artery without angina pectoris: Secondary | ICD-10-CM | POA: Diagnosis present

## 2010-11-25 DIAGNOSIS — E7800 Pure hypercholesterolemia, unspecified: Secondary | ICD-10-CM | POA: Diagnosis present

## 2010-11-25 DIAGNOSIS — E119 Type 2 diabetes mellitus without complications: Secondary | ICD-10-CM | POA: Diagnosis present

## 2010-11-25 DIAGNOSIS — M1990 Unspecified osteoarthritis, unspecified site: Secondary | ICD-10-CM | POA: Diagnosis present

## 2010-11-25 DIAGNOSIS — Z96649 Presence of unspecified artificial hip joint: Secondary | ICD-10-CM

## 2010-11-25 DIAGNOSIS — F17200 Nicotine dependence, unspecified, uncomplicated: Secondary | ICD-10-CM | POA: Diagnosis present

## 2010-11-25 DIAGNOSIS — Z8249 Family history of ischemic heart disease and other diseases of the circulatory system: Secondary | ICD-10-CM

## 2010-11-25 LAB — CBC
HCT: 34.5 % — ABNORMAL LOW (ref 36.0–46.0)
Hemoglobin: 11.9 g/dL — ABNORMAL LOW (ref 12.0–15.0)
MCH: 30.3 pg (ref 26.0–34.0)
MCHC: 34.5 g/dL (ref 30.0–36.0)
MCV: 87.8 fL (ref 78.0–100.0)
RBC: 3.93 MIL/uL (ref 3.87–5.11)
RDW: 13.8 % (ref 11.5–15.5)

## 2010-11-26 ENCOUNTER — Inpatient Hospital Stay (HOSPITAL_COMMUNITY): Payer: Medicare Other

## 2010-11-26 ENCOUNTER — Emergency Department (HOSPITAL_COMMUNITY): Payer: Medicare Other

## 2010-11-26 LAB — CARDIAC PANEL(CRET KIN+CKTOT+MB+TROPI)
CK, MB: 2.2 ng/mL (ref 0.3–4.0)
Relative Index: INVALID (ref 0.0–2.5)
Relative Index: INVALID (ref 0.0–2.5)
Total CK: 61 U/L (ref 7–177)
Troponin I: <0.3 ng/mL (ref <0.30)
Troponin I: <0.3 ng/mL (ref <0.30)

## 2010-11-26 LAB — COMPREHENSIVE METABOLIC PANEL
ALT: 13 U/L (ref 0–35)
Albumin: 3.6 g/dL (ref 3.5–5.2)
Chloride: 103 mEq/L (ref 96–112)
Total Bilirubin: 0.2 mg/dL — ABNORMAL LOW (ref 0.3–1.2)

## 2010-11-26 LAB — LIPID PANEL
LDL Cholesterol: 80 mg/dL (ref 0–99)
VLDL: 30 mg/dL (ref 0–40)

## 2010-11-26 LAB — BASIC METABOLIC PANEL
BUN: 15 mg/dL (ref 6–23)
CO2: 23 mEq/L (ref 19–32)
Calcium: 9.1 mg/dL (ref 8.4–10.5)
Creatinine, Ser: 0.77 mg/dL (ref 0.4–1.2)
GFR calc non Af Amer: >60 mL/min (ref >60)
Glucose, Bld: 97 mg/dL (ref 70–99)
Sodium: 140 mEq/L (ref 135–145)

## 2010-11-26 LAB — URINALYSIS, ROUTINE W REFLEX MICROSCOPIC
Bilirubin Urine: NEGATIVE
Ketones, ur: NEGATIVE mg/dL
Leukocytes, UA: NEGATIVE
Nitrite: NEGATIVE
Protein, ur: NEGATIVE mg/dL
Specific Gravity, Urine: 1.008 (ref 1.005–1.030)
Urobilinogen, UA: 0.2 mg/dL (ref 0.0–1.0)

## 2010-11-26 LAB — CBC
HCT: 36.9 % (ref 36.0–46.0)
Hemoglobin: 12.5 g/dL (ref 12.0–15.0)
MCV: 88.7 fL (ref 78.0–100.0)
Platelets: 152 10*3/uL (ref 150–400)
RDW: 13.7 % (ref 11.5–15.5)
WBC: 6.5 10*3/uL (ref 4.0–10.5)

## 2010-11-26 LAB — CK TOTAL AND CKMB (NOT AT ARMC)
CK, MB: 2 ng/mL (ref 0.3–4.0)
Total CK: 65 U/L (ref 7–177)

## 2010-11-26 LAB — URINE MICROSCOPIC-ADD ON

## 2010-11-26 LAB — APTT: aPTT: 160 seconds — ABNORMAL HIGH (ref 24–37)

## 2010-11-27 ENCOUNTER — Inpatient Hospital Stay (HOSPITAL_COMMUNITY): Payer: Medicare Other

## 2010-11-27 ENCOUNTER — Other Ambulatory Visit (HOSPITAL_COMMUNITY): Payer: Medicare Other

## 2010-11-27 LAB — GLUCOSE, CAPILLARY
Glucose-Capillary: 124 mg/dL — ABNORMAL HIGH (ref 70–99)
Glucose-Capillary: 119 mg/dL — ABNORMAL HIGH (ref 70–99)

## 2010-11-27 LAB — CBC
Hemoglobin: 11.6 g/dL — ABNORMAL LOW (ref 12.0–15.0)
MCH: 29.4 pg (ref 26.0–34.0)
MCV: 88.6 fL (ref 78.0–100.0)
Platelets: 140 10*3/uL — ABNORMAL LOW (ref 150–400)
RBC: 3.94 MIL/uL (ref 3.87–5.11)
WBC: 6.4 10*3/uL (ref 4.0–10.5)

## 2010-11-27 MED ORDER — TECHNETIUM TC 99M TETROFOSMIN IV KIT
10.0000 | PACK | Freq: Once | INTRAVENOUS | Status: AC | PRN
  Administered 2010-11-27: 10 via INTRAVENOUS

## 2010-11-27 MED ORDER — TECHNETIUM TC 99M TETROFOSMIN IV KIT
30.0000 | PACK | Freq: Once | INTRAVENOUS | Status: AC | PRN
  Administered 2010-11-27: 30 via INTRAVENOUS

## 2010-11-28 ENCOUNTER — Other Ambulatory Visit (HOSPITAL_COMMUNITY): Payer: Medicare Other

## 2010-11-28 LAB — CBC
HCT: 34.6 % — ABNORMAL LOW (ref 36.0–46.0)
MCHC: 33.8 g/dL (ref 30.0–36.0)
MCV: 87.2 fL (ref 78.0–100.0)
Platelets: 143 10*3/uL — ABNORMAL LOW (ref 150–400)
RDW: 13.5 % (ref 11.5–15.5)
WBC: 5.8 10*3/uL (ref 4.0–10.5)

## 2010-11-28 LAB — GLUCOSE, CAPILLARY
Glucose-Capillary: 105 mg/dL — ABNORMAL HIGH (ref 70–99)
Glucose-Capillary: 98 mg/dL (ref 70–99)

## 2010-11-29 LAB — PLATELET INHIBITION P2Y12
Platelet Function  P2Y12: 401 [PRU] (ref 194–418)
Platelet Function Baseline: 347 [PRU] (ref 194–418)

## 2010-11-29 LAB — CBC
Hemoglobin: 11.8 g/dL — ABNORMAL LOW (ref 12.0–15.0)
MCH: 29.8 pg (ref 26.0–34.0)
Platelets: 130 10*3/uL — ABNORMAL LOW (ref 150–400)
RBC: 3.96 MIL/uL (ref 3.87–5.11)
WBC: 5.6 10*3/uL (ref 4.0–10.5)

## 2010-11-29 LAB — HEPARIN LEVEL (UNFRACTIONATED): Heparin Unfractionated: 0.42 IU/mL (ref 0.30–0.70)

## 2010-11-29 LAB — GLUCOSE, CAPILLARY
Glucose-Capillary: 100 mg/dL — ABNORMAL HIGH (ref 70–99)
Glucose-Capillary: 98 mg/dL (ref 70–99)

## 2010-11-29 LAB — POCT ACTIVATED CLOTTING TIME: Activated Clotting Time: 122 seconds

## 2010-12-08 NOTE — Cardiovascular Report (Signed)
[name], [name]                ACCOUNT NO.:  [id number]  MEDICAL RECORD NO.:  [id number]  LOCATION:  3709                         FACILITY:  [facility]  PHYSICIAN:  Eduardo Osier. Sharyn Lull, M.D. DATE OF BIRTH:  08-04-1940  DATE OF PROCEDURE:  11/29/2010 DATE OF DISCHARGE:  11/29/2010                           CARDIAC CATHETERIZATION   PROCEDURES:  Left cardiac catheterization with selective left and right coronary angiography, left ventriculography via right groin using Judkins technique.  INDICATIONS FOR PROCEDURE:  Ms. 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  She came to the ER complaining of recurrent retrosternal chest pain radiating to precordial area, lasting few minutes.  She went to graduation party and then again had similar chest pain, took three sublingual nitro with partial relief.  Denies any nausea, vomiting, or diaphoresis.  States lately feels tired, fatigued easily toward and had to lie down.  Denies any shortness of breath.  Denies palpitation, lightheadedness, or syncope.  Denies PND, orthopnea, or leg swelling.  Denies cough, fever, or chills.  PAST MEDICAL HISTORY:  As above.  PAST SURGICAL HISTORY:  She had bilateral hip replacement in the past, had right ankle surgery in the past.  ALLERGIES:  No known drug allergies.  MEDICATIONS AT HOME:  She was on aspirin, Plavix, Toprol, ramipril, Imdur, Azor and Nitrostat.  SOCIAL HISTORY:  She is married, smoked one and a half pack per day to two packs per day for 40 plus years and now smokes three to four cigarettes  per day and intends to quit.  No history of smoking.  No history of alcohol abuse.  She is retired.  FAMILY HISTORY:  Positive for coronary artery disease.  PHYSICAL EXAMINATION:  GENERAL:  She was alert, awake and oriented x3 in no acute distress. VITAL SIGNS:  Blood pressure was 156/83, pulse was 78. HEENT:  Conjunctivae was pink. NECK:  Supple.  No JVD.  No bruit. LUNGS:  Clear to auscultation without rhonchi or rales. CARDIOVASCULAR:  S1-S2 was normal.  There was soft systolic murmur. There was no S3 gallop. ABDOMEN:  Soft.  Bowel sounds were present, nontender. EXTREMITIES:  There was no clubbing, cyanosis, or edema.  Two sets of cardiac enzymes were negative.  EKG showed normal sinus rhythm with first-degree AV block, left axis deviation, old lateral wall MI, no new acute ischemic changes.  BRIEF HOSPITAL COURSE:  The patient was admitted to telemetry unit.  MI was ruled out by serial enzymes and EKG.  The patient subsequently underwent Persantine Myoview, which showed lateral wall ischemia with normal EF.  Discussed with the patient regarding Persantine Myoview result and various options of treatment i.e. medical versus invasive, left cath, possible PTCA and stenting, its risks and benefits i.e. death, MI, stroke, need for emergency CABG, local vascular complications, risk of restenosis, etc. and consented for the procedure.  PROCEDURE:  After obtaining informed  consent, the patient was brought to the Cath Lab and was placed on fluoroscopy table.  The right groin was prepped and draped in usual fashion.  Xylocaine 1% was used for local anesthesia in the right groin.  With the help of thin-wall needle, 6- French arterial sheath was placed.  The sheath was aspirated and flushed.  Next, 6-French left Judkins catheter was advanced over the wire under fluoroscopic guidance up to the ascending aorta.  Wire was pulled out, the catheter was aspirated and connected to the  manifold. Catheter was further advanced and engaged into left coronary ostium. Multiple views of the left system were taken.  Next, the catheter was disengaged and was pulled out over the wire and was replaced with 6- [place] right Judkins catheter, which was advanced over the wire under fluoroscopic guidance up to the ascending aorta.  Wire was pulled out, the catheter was aspirated and connected to the manifold.  Catheter was further advanced and engaged into right coronary ostium.  Multiple views of the right system were taken.  Next, the catheter was disengaged and was pulled out over the wire and was replaced with 6-French pigtail catheter, which was advanced over the wire under fluoroscopic guidance up to the ascending aorta.  Wire was pulled out, the catheter was aspirated and connected to the manifold.  Catheter was further advanced across the aortic valve into the LV.  LV pressures were recorded.  Next, left ventriculograph was done in 30-degree RAO position.  Post- angiographic pressures were recorded from LV and then pullback pressures were recorded from the aorta.  There was no gradient across the aortic valve.  Next, pigtail catheter was pulled out over the wire.  Sheaths were aspirated and flushed.  FINDINGS:  LV showed good LV systolic function, EF of 55-60%.  Left main was short, which was patent.  LAD has 20-30% proximal and mid stenosis. Diagonal 1 is small, which has 10-15% ostial and mid stenosis.  Left circumflex proximal stent is widely patent and has 30-40% focal stenosis just beyond the stent at prior PTCA site.  OM-1 is very very small.  OM- 2 is moderate size, which is patent.  The RCA has 70-40% mid stenosis. Stented segment is patent.  PDA is small, which is patent.  The patient tolerated the procedure well.  There were no complications.  The patient was transferred to the recovery room in stable condition.     Eduardo Osier. Sharyn Lull, M.D.     MNH/MEDQ  D:   11/29/2010  T:  11/30/2010  Job:  [id number]  Electronically Signed by Rinaldo Cloud M.D. on 12/08/2010 10:05:47 AM

## 2010-12-08 NOTE — Discharge Summary (Signed)
[name], [name]                ACCOUNT NO.:  [id number]  MEDICAL RECORD NO.:  [id number]  LOCATION:                                 FACILITY:  PHYSICIAN:  Xavior Niazi N. Sharyn Lull, M.D. DATE OF BIRTH:  03-01-1941  DATE OF ADMISSION: DATE OF DISCHARGE:                              DISCHARGE SUMMARY   ADMITTING DIAGNOSES: 1. Recurrent chest pain, rule out myocardial infarction. 2. Coronary artery disease. 3. History of inferoposterior wall myocardial infarction in the past     status post percutaneous coronary intervention to left circumflex     and right coronary artery in the past. 4. Hypertension. 5. Non-insulin-dependent diabetes mellitus, controlled by diet. 6. Hypercholesteremia. 7. Tobacco abuse. 8. Chronic obstructive pulmonary disease. 9. Positive family history of coronary artery disease.  DISCHARGE DIAGNOSES: 1. Status post chest pain, myocardial infarction ruled out, mildly     positive Persantine Myoview status post left cardiac cath. 2. Coronary artery disease. 3. History of inferoposterolateral wall myocardial infarction status     post percutaneous coronary intervention to left circumflex and     right coronary artery with patent left circumflex and right     coronary artery stents. 4. Hypertension. 5. Non-insulin-dependent diabetes mellitus, controlled by diet. 6. Hypercholesteremia. 7. Tobacco abuse. 8. Chronic obstructive pulmonary disease. 9. Positive family history of coronary artery disease.  DISCHARGE HOME MEDICATIONS: 1. Enteric-coated aspirin 81 mg one tablet daily. 2. Brilinta 90 mg 1 tablet twice daily. 3. Pepcid 20 mg one tablet twice daily. 4. Nitrostat sublingual 0.4 mg use as directed. 5. Metoprolol tartrate 100 mg half tablet twice daily. 6. Isosorbide mononitrate 60 mg one tablet daily. 7. Lipitor 80 mg 1 tablet daily. 8. Ramipril 10 mg 1 capsule daily.  DIET:  Low-salt, low-cholesterol 1800 calories ADA diet.  The patient has been  advised to stop smoking completely.  Condition at discharge is stable.  Postcardiac cath instructions have been given.  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  [name] had PTCA, stenting to left circumflex and then subsequently had PTCA, stenting to RCA in the past.  Hypertension; hypercholesteremia; history of nonsustained VT; non-insulin-dependent diabetes mellitus, controlled by diet; tobacco abuse; degenerative joint disease; positive family history of coronary artery disease.  [name] came to the ER complaining of recurrent retrosternal chest pain radiating to precordial area, lasting few minutes.  The patient states [name] went to graduation party and again had similar chest pain.  [name] took three sublingual nitro with partial relief.  Denies any nausea, vomiting, diaphoresis.  States lately feels tired, fatigued with no energy and has to lay down with minimal exertion.  Denies any shortness of breath.  Denies palpitation, lightheadedness, or syncope.  Denies PND, orthopnea, leg swelling. Denies any cough, fever, or chills.  PAST MEDICAL HISTORY:  As above.  PAST SURGICAL HISTORY:  [name] had bilateral hip surgery, had right ankle surgery in the past.  MEDICATIONS AT HOME:  [name] is on aspirin, Plavix, Toprol, ramipril, Imdur, Azor, and Nitrostat sublingual.  SOCIAL HISTORY:  [name] is married.  Smoked one and a half to two packs per day for 40 years and now smokes three to four cigarettes per day.  No history of alcohol abuse.  [name] is retired.  FAMILY HISTORY:  Positive for coronary artery disease.  PHYSICAL EXAMINATION:  GENERAL:  [name] is alert, awake, and oriented x3, in no acute distress. VITAL SIGNS:  Blood pressure was 156/83, pulse was 78. HEENT:  Conjunctivae pink. NECK:  Supple.  No JVD, no bruit. LUNGS:  Clear to auscultation  without rhonchi or rales. CARDIOVASCULAR:  S1 and S2 were normal.  There was soft systolic murmur. There was no S3 gallop. ABDOMEN:  Soft.  Bowel sounds were present, nontender. EXTREMITIES:  There was no clubbing, cyanosis, or edema. NEUROLOGIC:  Grossly intact.  LABORATORY DATA:  Hemoglobin was 11.9, hematocrit 34.5, white count of 8.1.  Sodium was 138, potassium 3.8, glucose 135, BUN 20, creatinine 1.08.  Three sets of cardiac enzymes were normal.  Her cholesterol was 156, triglycerides 151, HDL 46, LDL of 80.  Hemoglobin A1c was 6.1. P2Y12 percent platelet inhibition was 0%.  PRU value was also elevated to 401.  BRIEF HOSPITAL COURSE:  The patient was admitted to telemetry unit, MI was ruled out by serial enzymes and EKG.  The patient subsequently underwent Persantine Myoview which showed lateral wall reversible ischemia with normal ejection fraction of 62%.  Due to typical anginal chest pain and a positive Persantine Myoview and multiple risk factors, I discussed with the patient regarding left cath, its risks and benefits and consented for the procedure.  The patient underwent left cardiac cath with selective left and right coronary angiography.  Today as per procedure report, the patient tolerated the procedure well.  There were no complications.  Postprocedure, the patient did not have any episodes of chest pain, is ambulating in room and hallway without any problems. Her groin is stable with no evidence of hematoma or bruit.  Smoking cessation consultation was also obtained, and the patient states [name] smokes mainly due to stress and is intending to quit smoking completely.  The patient will be followed up in my office in 1 week.     Eduardo Osier. Sharyn Lull, M.D.     MNH/MEDQ  D:  11/29/2010  T:  11/30/2010  Job:  [id number]  Electronically Signed by Rinaldo Cloud M.D. on 12/08/2010 10:05:59 AM

## 2011-08-11 ENCOUNTER — Encounter: Payer: Self-pay | Admitting: Surgery

## 2011-08-14 ENCOUNTER — Encounter: Payer: Self-pay | Admitting: Surgery

## 2011-08-14 ENCOUNTER — Other Ambulatory Visit (INDEPENDENT_AMBULATORY_CARE_PROVIDER_SITE_OTHER): Payer: Medicare Other | Admitting: *Deleted

## 2011-08-14 ENCOUNTER — Ambulatory Visit (INDEPENDENT_AMBULATORY_CARE_PROVIDER_SITE_OTHER): Payer: Medicare Other | Admitting: Surgery

## 2011-08-14 VITALS — BP 156/71 | HR 66 | Resp 16 | Ht 61.0 in | Wt 145.0 lb

## 2011-08-14 DIAGNOSIS — I6529 Occlusion and stenosis of unspecified carotid artery: Secondary | ICD-10-CM
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
## 2011-08-15 ENCOUNTER — Telehealth: Payer: Self-pay

## 2011-08-15 NOTE — Telephone Encounter (Signed)
Phone call to Dr.Harwani's office to question if pt. is stable from cardiac standpoint to undergo general anesthesia for a right carotid endarterectomy.  Dr. Myra Gianotti evaluated pt. on 08/14/11, and the plan to do a right carotid endarterectomy vs right carotid stent is dependent upon pt's cardiac clearance. The plan is to proceed with procedure on 09/05/11.  Advised to fax information to Dr. Annitta Jersey office for him to review.  Office note faxed to Dr. Sharyn Lull.

## 2011-08-28 ENCOUNTER — Other Ambulatory Visit: Payer: Self-pay

## 2011-08-28 DIAGNOSIS — I6529 Occlusion and stenosis of unspecified carotid artery: Secondary | ICD-10-CM

## 2011-08-28 NOTE — Procedures (Unsigned)
CAROTID DUPLEX EXAM  INDICATION:  Carotid artery disease.  HISTORY: Diabetes:  No Cardiac:  MI in 2007 with 5 cardiac stents Hypertension:  Yes Smoking:  Yes Previous Surgery: CV History:  None Amaurosis Fugax No, Paresthesias No, Hemiparesis No                                      RIGHT             LEFT Brachial systolic pressure:         164               138 Brachial Doppler waveforms:         Triphasic         Biphasic Vertebral direction of flow:        Antegrade         Retrograde DUPLEX VELOCITIES (cm/sec) CCA peak systolic                   67                98 ECA peak systolic                   372               162 ICA peak systolic                   556               178 ICA end diastolic                   185               51 PLAQUE MORPHOLOGY:                  Calcific          Calcific PLAQUE AMOUNT:                      Severe            Moderate PLAQUE LOCATION:                    Bifurcation ICA and ECA             Bifurcation ICA and ECA  IMPRESSION: 1. Eighty to 99% right ICA stenosis. 2. Severe right ECA stenosis. 3. Forty to 59% left ICA stenosis. 4. Retrograde left vertebral artery flow. 5. Antegrade right vertebral artery flow.  ___________________________________________ V. Charlena Cross, MD  SS/MEDQ  D:  08/14/2011  T:  08/14/2011  Job:  [id number]  cc:   Eduardo Osier. Sharyn Lull, M.D.

## 2011-08-28 NOTE — Procedures (Unsigned)
CAROTID DUPLEX EXAM  INDICATION:  Carotid artery disease.  HISTORY: Diabetes:  No. Cardiac:  MI 2007 with 5 stents. Hypertension:  Yes. Smoking:  Yes. Previous Surgery: CV History:  None. Amaurosis Fugax No, Paresthesias No, Hemiparesis No                                      RIGHT             LEFT Brachial systolic pressure:         164               138 Brachial Doppler waveforms:         Triphasic         Biphasic Vertebral direction of flow:        Antegrade         Retrograde DUPLEX VELOCITIES (cm/sec) CCA peak systolic                   67                98 ECA peak systolic                   372               162 ICA peak systolic                   556               178 ICA end diastolic                   185               51 PLAQUE MORPHOLOGY:                  Calcific          Calcific PLAQUE AMOUNT:                      Severe            Moderate PLAQUE LOCATION:                    Bifurcation, ICA, and ECA           Bifurcation, ICA, and ECA  IMPRESSION: 1. 80-99% right internal carotid artery stenosis, within critical     range. 2. Severe right external carotid artery stenosis. 3. 40-59% left internal carotid artery stenosis. 4. Retrograde left vertebral artery flow.  Antegrade right vertebral     artery flow.  ___________________________________________ V. Charlena Cross, MD  SS/MEDQ  D:  08/14/2011  T:  08/14/2011  Job:  [id number]

## 2011-08-31 ENCOUNTER — Other Ambulatory Visit: Payer: Self-pay | Admitting: Surgery

## 2011-08-31 LAB — BUN: BUN: 15 mg/dL (ref 6–23)

## 2011-09-01 ENCOUNTER — Ambulatory Visit
Admission: RE | Admit: 2011-09-01 | Discharge: 2011-09-01 | Disposition: A | Discharge status: Home / Self Care | Payer: Medicare Other | Source: Ambulatory Visit | Attending: Surgery | Admitting: Surgery

## 2011-09-01 DIAGNOSIS — I6529 Occlusion and stenosis of unspecified carotid artery: Secondary | ICD-10-CM

## 2011-09-01 MED ORDER — IOHEXOL 350 MG/ML SOLN
100.0000 mL | Freq: Once | INTRAVENOUS | Status: AC | PRN
  Administered 2011-09-01: 100 mL via INTRAVENOUS

## 2011-09-22 ENCOUNTER — Encounter: Payer: Self-pay | Admitting: Surgery

## 2011-09-25 ENCOUNTER — Encounter: Payer: Self-pay | Admitting: Surgery

## 2011-09-25 ENCOUNTER — Ambulatory Visit (INDEPENDENT_AMBULATORY_CARE_PROVIDER_SITE_OTHER): Payer: Medicare Other | Admitting: Surgery

## 2011-09-25 VITALS — BP 158/82 | HR 69 | Temp 98.0°F | Ht 61.0 in | Wt 148.0 lb

## 2011-09-25 DIAGNOSIS — I6529 Occlusion and stenosis of unspecified carotid artery: Secondary | ICD-10-CM
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
## 2011-09-27 ENCOUNTER — Other Ambulatory Visit: Payer: Self-pay

## 2011-09-29 ENCOUNTER — Encounter (HOSPITAL_COMMUNITY): Payer: Self-pay | Admitting: Pharmacy Technician

## 2011-10-03 NOTE — Pre-Procedure Instructions (Signed)
[address] An Donigan  10/03/2011   Your procedure is scheduled on:  April 25  Report to [facility] Short Stay Center at 0730 AM.  Call this number if you have problems the morning of surgery: [phone]   Remember:   Do not eat food:After Midnight.  May have clear liquids: up to 4 Hours before arrival.0330  Clear liquids include soda, tea, black coffee, apple or grape juice, broth.  Take these medicines the morning of surgery with A SIP OF WATER: AMLODIPINE,IMDUR,PRILOSEC ,METOPROLOL,PROTONIX,INHALER AS NEEDED   Do not wear jewelry, make-up or nail polish.  Do not wear lotions, powders, or perfumes. You may wear deodorant.  Do not shave 48 hours prior to surgery.  Do not bring valuables to the hospital.  Contacts, dentures or bridgework may not be worn into surgery.  Leave suitcase in the car. After surgery it may be brought to your room.  For patients admitted to the hospital, checkout time is 11:00 AM the day of discharge.   Patients discharged the day of surgery will not be allowed to drive home.  Name and phone number of your driver: FAMILY  Special Instructions: CHG Shower Use Special Wash: 1/2 bottle night before surgery and 1/2 bottle morning of surgery.   Please read over the following fact sheets that you were given: Pain Booklet, Blood Transfusion Information, MRSA Information and Surgical Site Infection Prevention

## 2011-10-04 ENCOUNTER — Encounter (HOSPITAL_COMMUNITY): Payer: Self-pay

## 2011-10-04 ENCOUNTER — Encounter (HOSPITAL_COMMUNITY)
Admission: RE | Admit: 2011-10-04 | Discharge: 2011-10-04 | Disposition: A | Discharge status: Home / Self Care | Payer: Medicare Other | Source: Ambulatory Visit | Attending: Surgery | Admitting: Surgery

## 2011-10-04 HISTORY — DX: Cardiac murmur, unspecified: R01.1

## 2011-10-04 HISTORY — DX: Acute upper respiratory infection, unspecified: J06.9

## 2011-10-04 HISTORY — DX: Essential (primary) hypertension: I10

## 2011-10-04 LAB — CBC
HCT: 39.7 % (ref 36.0–46.0)
Hemoglobin: 13.5 g/dL (ref 12.0–15.0)
MCH: 30.5 pg (ref 26.0–34.0)
RBC: 4.43 MIL/uL (ref 3.87–5.11)

## 2011-10-04 LAB — URINALYSIS, ROUTINE W REFLEX MICROSCOPIC
Ketones, ur: NEGATIVE mg/dL
Leukocytes, UA: NEGATIVE
Protein, ur: NEGATIVE mg/dL
Urobilinogen, UA: 0.2 mg/dL (ref 0.0–1.0)

## 2011-10-04 LAB — COMPREHENSIVE METABOLIC PANEL
ALT: 15 U/L (ref 0–35)
Alkaline Phosphatase: 97 U/L (ref 39–117)
BUN: 21 mg/dL (ref 6–23)
CO2: 24 mEq/L (ref 19–32)
GFR calc Af Amer: 73 mL/min — ABNORMAL LOW (ref >90)
GFR calc non Af Amer: 63 mL/min — ABNORMAL LOW (ref >90)
Glucose, Bld: 80 mg/dL (ref 70–99)
Potassium: 4.3 mEq/L (ref 3.5–5.1)
Sodium: 137 mEq/L (ref 135–145)

## 2011-10-04 LAB — APTT: aPTT: 36 seconds (ref 24–37)

## 2011-10-04 LAB — TYPE AND SCREEN
ABO/RH(D): O POS
Antibody Screen: NEGATIVE

## 2011-10-04 LAB — PROTIME-INR: INR: 1.03 (ref 0.00–1.49)

## 2011-10-04 LAB — DIFFERENTIAL
Lymphs Abs: 2 10*3/uL (ref 0.7–4.0)
Monocytes Absolute: 1.3 10*3/uL — ABNORMAL HIGH (ref 0.1–1.0)
Monocytes Relative: 12 % (ref 3–12)
Neutrophils Relative %: 70 % (ref 43–77)

## 2011-10-04 LAB — SURGICAL PCR SCREEN: Staphylococcus aureus: NEGATIVE

## 2011-10-04 NOTE — Pre-Procedure Instructions (Signed)
20 [address] An Weist  10/04/2011   Your procedure is scheduled on:  Thursday April 25,2013 at 0930 am  Report to [facility] Short Stay Center at 0730 amAM.  Call this number if you have problems the morning of surgery: [phone]   Remember:   Do not eat food:After Midnight.  May have clear liquids: up to 4 Hours before arrival.0330 am  Clear liquids include soda, tea, black coffee, apple or grape juice, broth.  Take these medicines the morning of surgery with A SIP OF WATER: Bring inhalers- Proventil and Symbicort.  Valium, Imdur,Toprol-XL, Prilosec and Protonix     Do not wear jewelry, make-up or nail polish.  Do not wear lotions, powders, or perfumes. You may wear deodorant.  Do not shave 48 hours prior to surgery.  Do not bring valuables to the hospital.  Contacts, dentures or bridgework may not be worn into surgery.  Leave suitcase in the car. After surgery it may be brought to your room.  For patients admitted to the hospital, checkout time is 11:00 AM the day of discharge.   Patients discharged the day of surgery will not be allowed to drive home.   Special Instructions: CHG Shower Use Special Wash: 1/2 bottle night before surgery and 1/2 bottle morning of surgery.   Please read over the following fact sheets that you were given: Pain Booklet, Coughing and Deep Breathing, Blood Transfusion Information, MRSA Information and Surgical Site Infection Prevention

## 2011-10-05 NOTE — Consult Note (Signed)
Anesthesia Chart Review:  Patient is a 71 year old female scheduled for right CEA on 10/12/11.  History includes smoking, anemia, HTN, CAD/MI '07 s/p PTCA/stent RCA, LCx murmur, skin cancer, URI 08/2011.  She also has a history of non-sustained VT and diet controlled DM according to Dr. Annitta Jersey cardiac clearance note.  Her EKG from 10/04/11 showed SB with first degree AVB, LAFB.    She had an abnormal stress test on 11/27/10 that showed reversibility involving the lateral wall is concerning for  pharmacologically induced ischemia. Mild lateral wall hypokinesia. Ejection fraction is 62%.   Subsequently she had a cardiac cath on 11/29/10 that showed, "good LV systolic function, EF of 55-60%. Left main was short, which was patent. LAD has 20-30% proximal and mid stenosis. Diagonal 1 is small, which has 10-15% ostial and mid stenosis. Left circumflex proximal stent is widely patent and has 30-40% focal stenosis just beyond the stent at prior PTCA site. OM-1 is very very small. OM- 2 is moderate size, which is patent. The RCA has 70-40% mid stenosis. Stented segment is patent. PDA is small, which is patent."  CXR from 10/04/11 showed stable examination with cardiomegaly and atherosclerosis. No acute  cardiopulmonary process  Labs acceptable.  Plan to proceed if no significant change in her status.

## 2011-10-11 MED ORDER — DEXTROSE 5 % IV SOLN
1.5000 g | INTRAVENOUS | Status: AC
  Administered 2011-10-12: 1.5 g via INTRAVENOUS
  Filled 2011-10-11: qty 1.5

## 2011-10-12 ENCOUNTER — Encounter (HOSPITAL_COMMUNITY)
Admission: RE | Disposition: A | Discharge status: Home / Self Care | Payer: Self-pay | Source: Ambulatory Visit | Attending: Surgery

## 2011-10-12 ENCOUNTER — Encounter (HOSPITAL_COMMUNITY): Payer: Self-pay | Admitting: Vascular Surgery

## 2011-10-12 ENCOUNTER — Ambulatory Visit (HOSPITAL_COMMUNITY): Payer: Medicare Other | Admitting: Vascular Surgery

## 2011-10-12 ENCOUNTER — Encounter (HOSPITAL_COMMUNITY): Payer: Self-pay | Admitting: *Deleted

## 2011-10-12 ENCOUNTER — Telehealth: Payer: Self-pay | Admitting: Surgery

## 2011-10-12 ENCOUNTER — Inpatient Hospital Stay (HOSPITAL_COMMUNITY)
Admission: RE | Admit: 2011-10-12 | Discharge: 2011-10-13 | DRG: 039 | Disposition: A | Discharge status: Home / Self Care | Payer: Medicare Other | Source: Ambulatory Visit | Attending: Surgery | Admitting: Surgery

## 2011-10-12 DIAGNOSIS — I252 Old myocardial infarction: Secondary | ICD-10-CM

## 2011-10-12 DIAGNOSIS — Z9861 Coronary angioplasty status: Secondary | ICD-10-CM

## 2011-10-12 DIAGNOSIS — I6529 Occlusion and stenosis of unspecified carotid artery: Secondary | ICD-10-CM

## 2011-10-12 DIAGNOSIS — I10 Essential (primary) hypertension: Secondary | ICD-10-CM | POA: Diagnosis present

## 2011-10-12 DIAGNOSIS — Z96649 Presence of unspecified artificial hip joint: Secondary | ICD-10-CM

## 2011-10-12 DIAGNOSIS — Z85828 Personal history of other malignant neoplasm of skin: Secondary | ICD-10-CM

## 2011-10-12 DIAGNOSIS — Z79899 Other long term (current) drug therapy: Secondary | ICD-10-CM

## 2011-10-12 DIAGNOSIS — Z7982 Long term (current) use of aspirin: Secondary | ICD-10-CM

## 2011-10-12 DIAGNOSIS — I658 Occlusion and stenosis of other precerebral arteries: Secondary | ICD-10-CM | POA: Diagnosis present

## 2011-10-12 DIAGNOSIS — I2510 Atherosclerotic heart disease of native coronary artery without angina pectoris: Secondary | ICD-10-CM | POA: Diagnosis present

## 2011-10-12 DIAGNOSIS — F17200 Nicotine dependence, unspecified, uncomplicated: Secondary | ICD-10-CM | POA: Diagnosis present

## 2011-10-12 DIAGNOSIS — Z7902 Long term (current) use of antithrombotics/antiplatelets: Secondary | ICD-10-CM

## 2011-10-12 HISTORY — PX: ENDARTERECTOMY: SHX5162

## 2011-10-12 SURGERY — ENDARTERECTOMY, CAROTID
Anesthesia: General | Site: Neck | Laterality: Right | Wound class: Clean

## 2011-10-12 MED ORDER — DEXTROSE-NACL 5-0.9 % IV SOLN
INTRAVENOUS | Status: DC
  Administered 2011-10-12: 100 mL/h via INTRAVENOUS
  Administered 2011-10-13: 03:00:00 via INTRAVENOUS

## 2011-10-12 MED ORDER — ASPIRIN EC 81 MG PO TBEC
162.0000 mg | DELAYED_RELEASE_TABLET | Freq: Every day | ORAL | Status: DC
  Administered 2011-10-13: 162 mg via ORAL
  Filled 2011-10-12: qty 2

## 2011-10-12 MED ORDER — HEPARIN SODIUM (PORCINE) 1000 UNIT/ML IJ SOLN
INTRAMUSCULAR | Status: DC | PRN
  Administered 2011-10-12: 6000 [IU] via INTRAVENOUS

## 2011-10-12 MED ORDER — AMLODIPINE-OLMESARTAN 10-40 MG PO TABS: 1.0000 | ORAL_TABLET | Freq: Every day | ORAL | Status: DC

## 2011-10-12 MED ORDER — DOPAMINE-DEXTROSE 3.2-5 MG/ML-% IV SOLN: 3.0000 ug/kg/min | INTRAVENOUS | Status: DC | PRN

## 2011-10-12 MED ORDER — HYDRALAZINE HCL 20 MG/ML IJ SOLN: 10.0000 mg | INTRAMUSCULAR | Status: DC | PRN

## 2011-10-12 MED ORDER — METOPROLOL SUCCINATE ER 100 MG PO TB24
100.0000 mg | ORAL_TABLET | Freq: Every day | ORAL | Status: DC
Start: 2011-10-13 — End: 2011-10-13
  Administered 2011-10-13: 100 mg via ORAL
  Filled 2011-10-12: qty 1

## 2011-10-12 MED ORDER — SODIUM CHLORIDE 0.9 % IV SOLN: 500.0000 mL | Freq: Once | INTRAVENOUS | Status: AC | PRN

## 2011-10-12 MED ORDER — GUAIFENESIN-DM 100-10 MG/5ML PO SYRP: 15.0000 mL | ORAL_SOLUTION | ORAL | Status: DC | PRN

## 2011-10-12 MED ORDER — ONDANSETRON HCL 4 MG/2ML IJ SOLN: 4.0000 mg | Freq: Four times a day (QID) | INTRAMUSCULAR | Status: DC | PRN

## 2011-10-12 MED ORDER — SODIUM CHLORIDE 0.9 % IR SOLN
Status: DC | PRN
  Administered 2011-10-12: 10:00:00

## 2011-10-12 MED ORDER — FENOFIBRATE 160 MG PO TABS
160.0000 mg | ORAL_TABLET | Freq: Every day | ORAL | Status: DC
  Administered 2011-10-13: 160 mg via ORAL
  Filled 2011-10-12: qty 1

## 2011-10-12 MED ORDER — AMLODIPINE BESYLATE 10 MG PO TABS
10.0000 mg | ORAL_TABLET | Freq: Every day | ORAL | Status: DC
  Administered 2011-10-13: 10 mg via ORAL
  Filled 2011-10-12: qty 1

## 2011-10-12 MED ORDER — NEOSTIGMINE METHYLSULFATE 1 MG/ML IJ SOLN
INTRAMUSCULAR | Status: DC | PRN
  Administered 2011-10-12: 4 mg via INTRAVENOUS

## 2011-10-12 MED ORDER — DEXTROSE 5 % IV SOLN
1.5000 g | Freq: Two times a day (BID) | INTRAVENOUS | Status: AC
  Administered 2011-10-12 – 2011-10-13 (×2): 1.5 g via INTRAVENOUS
  Filled 2011-10-12 (×2): qty 1.5

## 2011-10-12 MED ORDER — DOCUSATE SODIUM 100 MG PO CAPS
100.0000 mg | ORAL_CAPSULE | Freq: Every day | ORAL | Status: DC
  Administered 2011-10-13: 100 mg via ORAL
  Filled 2011-10-12: qty 1

## 2011-10-12 MED ORDER — LIDOCAINE HCL 4 % MT SOLN
OROMUCOSAL | Status: DC | PRN
  Administered 2011-10-12: 4 mL via TOPICAL

## 2011-10-12 MED ORDER — PROTAMINE SULFATE 10 MG/ML IV SOLN
INTRAVENOUS | Status: DC | PRN
  Administered 2011-10-12: 20 mg via INTRAVENOUS
  Administered 2011-10-12: 10 mg via INTRAVENOUS
  Administered 2011-10-12: 20 mg via INTRAVENOUS

## 2011-10-12 MED ORDER — HYDROMORPHONE HCL PF 1 MG/ML IJ SOLN: 0.2500 mg | INTRAMUSCULAR | Status: DC | PRN

## 2011-10-12 MED ORDER — ROCURONIUM BROMIDE 100 MG/10ML IV SOLN
INTRAVENOUS | Status: DC | PRN
  Administered 2011-10-12: 50 mg via INTRAVENOUS

## 2011-10-12 MED ORDER — DROPERIDOL 2.5 MG/ML IJ SOLN: 0.6250 mg | INTRAMUSCULAR | Status: DC | PRN

## 2011-10-12 MED ORDER — MORPHINE SULFATE 2 MG/ML IJ SOLN
2.0000 mg | INTRAMUSCULAR | Status: DC | PRN
  Administered 2011-10-12 (×2): 2 mg via INTRAVENOUS
  Filled 2011-10-12 (×2): qty 1

## 2011-10-12 MED ORDER — ALBUTEROL SULFATE HFA 108 (90 BASE) MCG/ACT IN AERS
INHALATION_SPRAY | RESPIRATORY_TRACT | Status: DC | PRN
  Administered 2011-10-12 (×2): 2 via RESPIRATORY_TRACT

## 2011-10-12 MED ORDER — PANTOPRAZOLE SODIUM 40 MG PO TBEC: 40.0000 mg | DELAYED_RELEASE_TABLET | Freq: Every day | ORAL | Status: DC

## 2011-10-12 MED ORDER — RAMIPRIL 1.25 MG PO CAPS
1.2500 mg | ORAL_CAPSULE | Freq: Every day | ORAL | Status: DC
  Administered 2011-10-13: 1.25 mg via ORAL
  Filled 2011-10-12: qty 1

## 2011-10-12 MED ORDER — GLYCOPYRROLATE 0.2 MG/ML IJ SOLN: INTRAMUSCULAR | Status: DC | PRN

## 2011-10-12 MED ORDER — LABETALOL HCL 5 MG/ML IV SOLN: 10.0000 mg | INTRAVENOUS | Status: DC | PRN

## 2011-10-12 MED ORDER — BUDESONIDE-FORMOTEROL FUMARATE 160-4.5 MCG/ACT IN AERO: 2.0000 | INHALATION_SPRAY | Freq: Two times a day (BID) | RESPIRATORY_TRACT | Status: DC | PRN

## 2011-10-12 MED ORDER — OXYCODONE-ACETAMINOPHEN 5-325 MG PO TABS
1.0000 | ORAL_TABLET | ORAL | Status: DC | PRN
  Administered 2011-10-12: 2 via ORAL
  Administered 2011-10-13 (×3): 1 via ORAL
  Filled 2011-10-12: qty 1
  Filled 2011-10-12: qty 2
  Filled 2011-10-12: qty 1

## 2011-10-12 MED ORDER — LACTATED RINGERS IV SOLN
INTRAVENOUS | Status: DC | PRN
  Administered 2011-10-12 (×2): via INTRAVENOUS

## 2011-10-12 MED ORDER — PHENYLEPHRINE HCL 10 MG/ML IJ SOLN
10.0000 mg | INTRAVENOUS | Status: DC | PRN
  Administered 2011-10-12: 15 ug/min via INTRAVENOUS

## 2011-10-12 MED ORDER — ONDANSETRON HCL 4 MG/2ML IJ SOLN
INTRAMUSCULAR | Status: DC | PRN
  Administered 2011-10-12: 4 mg via INTRAVENOUS

## 2011-10-12 MED ORDER — POTASSIUM CHLORIDE CRYS ER 20 MEQ PO TBCR: 20.0000 meq | EXTENDED_RELEASE_TABLET | Freq: Once | ORAL | Status: AC | PRN

## 2011-10-12 MED ORDER — ATORVASTATIN CALCIUM 40 MG PO TABS
40.0000 mg | ORAL_TABLET | Freq: Every day | ORAL | Status: DC
  Administered 2011-10-12: 40 mg via ORAL
  Filled 2011-10-12 (×2): qty 1

## 2011-10-12 MED ORDER — METOPROLOL TARTRATE 1 MG/ML IV SOLN: 2.0000 mg | INTRAVENOUS | Status: DC | PRN

## 2011-10-12 MED ORDER — EPHEDRINE SULFATE 50 MG/ML IJ SOLN
INTRAMUSCULAR | Status: DC | PRN
  Administered 2011-10-12: 10 mg via INTRAVENOUS

## 2011-10-12 MED ORDER — GLYCOPYRROLATE 0.2 MG/ML IJ SOLN
INTRAMUSCULAR | Status: DC | PRN
  Administered 2011-10-12 (×2): 0.1 mg via INTRAVENOUS
  Administered 2011-10-12: 0.6 mg via INTRAVENOUS

## 2011-10-12 MED ORDER — LIDOCAINE HCL (CARDIAC) 20 MG/ML IV SOLN
INTRAVENOUS | Status: DC | PRN
  Administered 2011-10-12: 40 mg via INTRAVENOUS

## 2011-10-12 MED ORDER — IRBESARTAN 300 MG PO TABS
300.0000 mg | ORAL_TABLET | Freq: Every day | ORAL | Status: DC
  Administered 2011-10-13: 300 mg via ORAL
  Filled 2011-10-12: qty 1

## 2011-10-12 MED ORDER — SODIUM CHLORIDE 0.9 % IV SOLN: INTRAVENOUS | Status: DC

## 2011-10-12 MED ORDER — ACETAMINOPHEN 325 MG PO TABS: 325.0000 mg | ORAL_TABLET | ORAL | Status: DC | PRN

## 2011-10-12 MED ORDER — CLOPIDOGREL BISULFATE 75 MG PO TABS
75.0000 mg | ORAL_TABLET | Freq: Every day | ORAL | Status: DC
  Administered 2011-10-13: 75 mg via ORAL
  Filled 2011-10-12 (×2): qty 1

## 2011-10-12 MED ORDER — PROPOFOL 10 MG/ML IV EMUL
INTRAVENOUS | Status: DC | PRN
  Administered 2011-10-12: 100 mg via INTRAVENOUS
  Administered 2011-10-12 (×2): 50 mg via INTRAVENOUS

## 2011-10-12 MED ORDER — PHENOL 1.4 % MT LIQD
1.0000 | OROMUCOSAL | Status: DC | PRN
  Filled 2011-10-12: qty 177

## 2011-10-12 MED ORDER — ARTIFICIAL TEARS OP OINT
TOPICAL_OINTMENT | OPHTHALMIC | Status: DC | PRN
  Administered 2011-10-12: 1 via OPHTHALMIC

## 2011-10-12 MED ORDER — 0.9 % SODIUM CHLORIDE (POUR BTL) OPTIME
TOPICAL | Status: DC | PRN
  Administered 2011-10-12: 1000 mL

## 2011-10-12 MED ORDER — ISOSORBIDE MONONITRATE ER 60 MG PO TB24
60.0000 mg | ORAL_TABLET | Freq: Every day | ORAL | Status: DC
Start: 2011-10-13 — End: 2011-10-13
  Administered 2011-10-13: 60 mg via ORAL
  Filled 2011-10-12: qty 1

## 2011-10-12 MED ORDER — ACETAMINOPHEN 650 MG RE SUPP: 325.0000 mg | RECTAL | Status: DC | PRN

## 2011-10-12 MED ORDER — FENTANYL CITRATE 0.05 MG/ML IJ SOLN
INTRAMUSCULAR | Status: DC | PRN
  Administered 2011-10-12 (×4): 50 ug via INTRAVENOUS

## 2011-10-12 MED ORDER — LIDOCAINE HCL (PF) 1 % IJ SOLN
INTRAMUSCULAR | Status: DC | PRN
  Administered 2011-10-12: 30 mL

## 2011-10-12 MED ORDER — OXYCODONE-ACETAMINOPHEN 5-325 MG PO TABS: 1.0000 | ORAL_TABLET | ORAL | Status: AC | PRN

## 2011-10-12 MED ORDER — HEMOSTATIC AGENTS (NO CHARGE) OPTIME
TOPICAL | Status: DC | PRN
  Administered 2011-10-12: 1 via TOPICAL

## 2011-10-12 MED ORDER — ALBUTEROL SULFATE (5 MG/ML) 0.5% IN NEBU: 2.5000 mg | INHALATION_SOLUTION | RESPIRATORY_TRACT | Status: DC | PRN

## 2011-10-12 MED ORDER — BISACODYL 10 MG RE SUPP: 10.0000 mg | Freq: Every day | RECTAL | Status: DC | PRN

## 2011-10-12 SURGICAL SUPPLY — 54 items
ADH SKN CLS APL DERMABOND .7 (GAUZE/BANDAGES/DRESSINGS) ×1
ADH SKN CLS LQ APL DERMABOND (GAUZE/BANDAGES/DRESSINGS) ×1
CANISTER SUCTION 2500CC (MISCELLANEOUS) ×2 IMPLANT
CATH ROBINSON RED A/P 18FR (CATHETERS) ×2 IMPLANT
CATH SUCT 10FR WHISTLE TIP (CATHETERS) ×2 IMPLANT
CLIP TI MEDIUM 24 (CLIP) ×2 IMPLANT
CLIP TI WIDE RED SMALL 24 (CLIP) ×2 IMPLANT
CLOTH BEACON ORANGE TIMEOUT ST (SAFETY) ×2 IMPLANT
COVER SURGICAL LIGHT HANDLE (MISCELLANEOUS) ×4 IMPLANT
CRADLE DONUT ADULT HEAD (MISCELLANEOUS) ×2 IMPLANT
DERMABOND ADHESIVE PROPEN (GAUZE/BANDAGES/DRESSINGS) ×1
DERMABOND ADVANCED (GAUZE/BANDAGES/DRESSINGS) ×1
DERMABOND ADVANCED .7 DNX12 (GAUZE/BANDAGES/DRESSINGS) ×1 IMPLANT
DERMABOND ADVANCED .7 DNX6 (GAUZE/BANDAGES/DRESSINGS) IMPLANT
DRAIN CHANNEL 15F RND FF W/TCR (WOUND CARE) IMPLANT
DRAPE WARM FLUID 44X44 (DRAPE) ×2 IMPLANT
ELECT REM PT RETURN 9FT ADLT (ELECTROSURGICAL) ×2
ELECTRODE REM PT RTRN 9FT ADLT (ELECTROSURGICAL) ×1 IMPLANT
EVACUATOR SILICONE 100CC (DRAIN) IMPLANT
GLOVE BIOGEL PI IND STRL 7.0 (GLOVE) IMPLANT
GLOVE BIOGEL PI IND STRL 7.5 (GLOVE) ×1 IMPLANT
GLOVE BIOGEL PI INDICATOR 7.0 (GLOVE) ×3
GLOVE BIOGEL PI INDICATOR 7.5 (GLOVE) ×1
GLOVE SURG SS PI 6.5 STRL IVOR (GLOVE) ×1 IMPLANT
GLOVE SURG SS PI 7.5 STRL IVOR (GLOVE) ×2 IMPLANT
GOWN PREVENTION PLUS XXLARGE (GOWN DISPOSABLE) ×2 IMPLANT
GOWN STRL NON-REIN LRG LVL3 (GOWN DISPOSABLE) ×4 IMPLANT
HEMOSTAT SNOW SURGICEL 2X4 (HEMOSTASIS) ×2 IMPLANT
HEMOSTAT SURGICEL 2X14 (HEMOSTASIS) IMPLANT
INSERT FOGARTY SM (MISCELLANEOUS) IMPLANT
KIT BASIN OR (CUSTOM PROCEDURE TRAY) ×2 IMPLANT
KIT ROOM TURNOVER OR (KITS) ×2 IMPLANT
KIT SUCTION CATH 14FR (SUCTIONS) ×1 IMPLANT
NDL HYPO 25GX1X1/2 BEV (NEEDLE) IMPLANT
NEEDLE HYPO 25GX1X1/2 BEV (NEEDLE) ×2 IMPLANT
NS IRRIG 1000ML POUR BTL (IV SOLUTION) ×4 IMPLANT
PACK CAROTID (CUSTOM PROCEDURE TRAY) ×2 IMPLANT
PAD ARMBOARD 7.5X6 YLW CONV (MISCELLANEOUS) ×4 IMPLANT
PATCH VASCULAR VASCU GUARD 1X6 (Vascular Products) ×1 IMPLANT
SHUNT CAROTID BYPASS 10 (VASCULAR PRODUCTS) IMPLANT
SHUNT CAROTID BYPASS 12FRX15.5 (VASCULAR PRODUCTS) IMPLANT
SPECIMEN JAR SMALL (MISCELLANEOUS) ×2 IMPLANT
SUT ETHILON 3 0 PS 1 (SUTURE) IMPLANT
SUT PROLENE 6 0 BV (SUTURE) ×10 IMPLANT
SUT PROLENE 7 0 BV 1 (SUTURE) IMPLANT
SUT VIC AB 3-0 SH 27 (SUTURE) ×4
SUT VIC AB 3-0 SH 27X BRD (SUTURE) ×2 IMPLANT
SUT VICRYL 4-0 PS2 18IN ABS (SUTURE) ×2 IMPLANT
SYR CONTROL 10ML LL (SYRINGE) ×1 IMPLANT
TOWEL OR 17X24 6PK STRL BLUE (TOWEL DISPOSABLE) ×2 IMPLANT
TOWEL OR 17X26 10 PK STRL BLUE (TOWEL DISPOSABLE) ×2 IMPLANT
TRAY FOLEY CATH 14FRSI W/METER (CATHETERS) ×2 IMPLANT
TRAY FOLEY METER SIL LF 16FR (CATHETERS) ×1 IMPLANT
WATER STERILE IRR 1000ML POUR (IV SOLUTION) ×2 IMPLANT

## 2011-10-12 NOTE — Op Note (Signed)
[facility]  Patient name: [name] MRN: [id number] DOB: [birth year] Sex: female  10/12/2011 Pre-operative Diagnosis: Asymptomatic   right carotid stenosis Post-operative diagnosis:  Same Surgeon:  Jorge Ny Assistants:  Della Goo, P.A. Procedure:    right carotid Endarterectomy with bovine pericardial  patch angioplasty Anesthesia:  General Blood Loss:  See anesthesia record Specimens:  Carotid Plaque to pathology  Findings:  80 %stenosis; Thrombus:  None  Indications:  This is a 71 year old female with progressive right sided asymptomatic carotid stenosis. She was found to have greater than 80% stenosis. This was confirmed with CT angiogram. She has been cleared from a cardiology perspective to proceed with surgery. I did discuss the possibility of stenting however I think with the calcification of her aortic arch that she is at lower risk for complications with surgery.  Procedure:  The patient was identified in the holding area and taken to [facility] OR ROOM 08  The patient was then placed supine on the table.   General endotrachial anesthesia was administered.  The patient was prepped and draped in the usual sterile fashion.  A time out was called and antibiotics were administered.  The incision was made along the anterior border of the right sternocleidomastoid muscle.  Cautery was used to dissect through the subcutaneous tissue.  The platysma muscle was divided with cautery.  The internal jugular vein was exposed along its anterior medial border.  The common facial vein was exposed and then divided between 2-0 silk ties and metal clips.  The common carotid artery was then circumferentially exposed and encircled with an umbilical tape.  The vagus nerve was identified and protected.  Next sharp dissection was used to expose the external carotid artery and the superior thyroid artery.  The were encircled with a blue vessel loop and a 2-0 silk tie  respectively.  Finally, the internal carotid was carefully dissected free.  An umbilical tape was placed around the internal carotid artery distal to the diseased segment.  The hypoglossal nerve was visualized throughout and protected.  A bovine carotid patch was selected and prepared on the back table.  A 10 french shunt was also prepared.  After blood pressure readings were appropriate the internal carotid artery was occluded with a baby Gregory clamp.  The external and common carotid arteries were then occluded with vascular clamps and the 2-0 tie tightened on the superior thyroid artery.  A #11 blade was used to make an arteriotomy in the common carotid artery.  This was extended with Potts scissors along the anterior and lateral border of the common and internal carotid artery.  Approximately 80% stenosis was identified.  There was no thrombus identified.  There was chronic thrombus within a very ulcerated plaque.  I elected not to place a shunt because the patient had pulsatile backbleeding.  A kleiner kuntz elevator was used to perform endarterectomy.  An eversion endarterectomy was performed in the external carotid artery.  A good distal endpoint was obtained in the internal carotid artery.  The specimen was removed and sent to pathology.  About this point I realized that heparin had been administered. The patient was given 6000 units of heparin. I flushed the common internal and external carotid artery. I did not identify any thrombus. Interestingly, there was no thrombus or clot present within our operative field, likely the result of the patient having been on Plavix previously. Heparinized saline was used to irrigate the endarterectomized field.  All potential  embolic debris was removed.  Bovine pericardial patch angioplasty was then performed using a running 6-0 Prolene.  The common internal and external carotid arteries were all appropriately flushed. The artery was again irrigated with heparin saline.   Throughout the entire procedure no thrombus or clot was identified. The anastomosis was then secured. The clamp was first released on the external carotid artery followed by the common carotid artery approximately 30 seconds later, bloodflow was reestablish through the internal carotid artery.  Next, a hand-held  Doppler was used to evaluate the signals in the common, external, and internal  carotid arteries, all of which had appropriate signals. I then administered  50 mg protamine. The wound was then irrigated.  After hemostasis was achieved, the carotid sheath was reapproximated with 3-0 Vicryl. The  platysma muscle was reapproximated with running 3-0 Vicryl. The skin  was closed with 4-0 Vicryl. Dermabond was placed on the skin. The  patient was then successfully extubated. His neurologic exam was  similar to his preprocedural exam.SHe was then taken to recovery room  in stable condition. There were no complications.     Disposition:  To PACU in stable condition.  Relevant Operative Details:  Hypoglossal was low.  It was very large and easily protected  V. Durene Cal, M.D. [facility] [place] Office: [phone] Pager:  [phone]

## 2011-10-12 NOTE — Anesthesia Procedure Notes (Signed)
Procedure Name: Intubation Date/Time: 10/12/2011 9:54 AM Performed by: Elon Alas Pre-anesthesia Checklist: Patient identified, Timeout performed, Emergency Drugs available, Suction available and Patient being monitored Patient Re-evaluated:Patient Re-evaluated prior to inductionOxygen Delivery Method: Circle system utilized Preoxygenation: Pre-oxygenation with 100% oxygen Intubation Type: IV induction and Cricoid Pressure applied Ventilation: Mask ventilation without difficulty and Oral airway inserted - appropriate to patient size Laryngoscope Size: Mac and 3 Grade View: Grade II Tube type: Oral Tube size: 7.0 mm Number of attempts: 1 Airway Equipment and Method: Stylet Placement Confirmation: ETT inserted through vocal cords under direct vision,  positive ETCO2 and breath sounds checked- equal and bilateral Secured at: 22 cm Tube secured with: Tape Dental Injury: Teeth and Oropharynx as per pre-operative assessment

## 2011-10-12 NOTE — Progress Notes (Signed)
Dentures and glasses ret to pt by family.

## 2011-10-12 NOTE — Preoperative (Signed)
Beta Blockers   Reason not to administer Beta Blockers:Not Applicable 

## 2011-10-12 NOTE — Progress Notes (Signed)
Care of pt assumed by MA Bev Drennen RN 

## 2011-10-12 NOTE — Anesthesia Preprocedure Evaluation (Addendum)
Anesthesia Evaluation  Patient identified by MRN, date of birth, ID band Patient awake    Reviewed: Allergy & Precautions, H&P , NPO status , Patient's Chart, lab work & pertinent test results, reviewed documented beta blocker date and time   History of Anesthesia Complications Negative for: history of anesthetic complications  Airway Mallampati: II TM Distance: >3 FB Neck ROM: Full    Dental  (+) Dental Advisory Given, Edentulous Upper and Edentulous Lower   Pulmonary Recent URI , Residual Cough, Current Smoker,  breath sounds clear to auscultation  Pulmonary exam normal       Cardiovascular hypertension, Pt. on home beta blockers + CAD, + Past MI and + Cardiac Stents Rhythm:Regular Rate:Normal  6/12 - EF via cath 55-60%, good systolic fxn, patent stent in RCA   Neuro/Psych    GI/Hepatic Neg liver ROS, GERD-  Medicated,  Endo/Other  negative endocrine ROS  Renal/GU negative Renal ROS     Musculoskeletal   Abdominal   Peds  Hematology   Anesthesia Other Findings   Reproductive/Obstetrics                         Anesthesia Physical Anesthesia Plan  ASA: III  Anesthesia Plan: General   Post-op Pain Management:    Induction: Intravenous  Airway Management Planned: Oral ETT  Additional Equipment: Arterial line  Intra-op Plan:   Post-operative Plan: Extubation in OR  Informed Consent: I have reviewed the patients History and Physical, chart, labs and discussed the procedure including the risks, benefits and alternatives for the proposed anesthesia with the patient or authorized representative who has indicated his/her understanding and acceptance.   Dental advisory given  Plan Discussed with: CRNA, Anesthesiologist and Surgeon  Anesthesia Plan Comments:         Anesthesia Quick Evaluation
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
## 2011-10-12 NOTE — Telephone Encounter (Signed)
Message copied by Fredrich Birks on Thu Oct 12, 2011  4:31 PM ------      Message from: Melene Plan      Created: Thu Oct 12, 2011  4:02 PM                   ----- Message -----         From: [place], [place]         Sent: 10/12/2011  11:48 AM           To: Melene Plan, RN            2 weeks F/U Myra Gianotti - R CEA

## 2011-10-12 NOTE — OR Nursing (Signed)
During Pre-op Assessment Patient stated she had a Latex Allergy which was not listed in the chart. She stated, "I can't wear latex gloves, but it's okay if you do". Informed Dr Myra Gianotti, of possible allergy Pre-op. Placed a latex free foley catheter, and latex free OR supplies.

## 2011-10-12 NOTE — Anesthesia Postprocedure Evaluation (Signed)
Anesthesia Post Note  Patient: [name]  Procedure(s) Performed: Procedure(s) (LRB): ENDARTERECTOMY CAROTID (Right)  Anesthesia type: general  Patient location: PACU  Post pain: Pain level controlled  Post assessment: Patient's Cardiovascular Status Stable  Last Vitals:  Filed Vitals:   10/12/11 1330  BP:   Pulse: 68  Temp:   Resp: 14    Post vital signs: Reviewed and stable  Level of consciousness: sedated  Complications: No apparent anesthesia complications

## 2011-10-12 NOTE — Transfer of Care (Signed)
Immediate Anesthesia Transfer of Care Note  Patient: [name]  Procedure(s) Performed: Procedure(s) (LRB): ENDARTERECTOMY CAROTID (Right)  Patient Location: PACU  Anesthesia Type: General  Level of Consciousness: awake, alert  and oriented  Airway & Oxygen Therapy: Patient Spontanous Breathing and Patient connected to nasal cannula oxygen  Post-op Assessment: Report given to PACU RN, Post -op Vital signs reviewed and stable, Patient moving all extremities X 4 and Patient able to stick tongue midline  Post vital signs: Reviewed and stable  Complications: No apparent anesthesia complications

## 2011-10-12 NOTE — Telephone Encounter (Signed)
Patient could not be reached today by phone. Mailed letter to home address to notify of upcoming appointment.   

## 2011-10-12 NOTE — Interval H&P Note (Signed)
History and Physical Interval Note:  10/12/2011 9:26 AM  [name]  has presented today for surgery, with the diagnosis of Right internal carotid artery stenosis   The various methods of treatment have been discussed with the patient and family. After consideration of risks, benefits and other options for treatment, the patient has consented to  Procedure(s) (LRB): ENDARTERECTOMY CAROTID (Right) as a surgical intervention .  The patients' history has been reviewed, patient examined, no change in status, stable for surgery.  I have reviewed the patients' chart and labs.  Questions were answered to the patient's satisfaction.     Standley Bargo IV, V. WELLS

## 2011-10-13 ENCOUNTER — Encounter (HOSPITAL_COMMUNITY): Payer: Self-pay | Admitting: Surgery

## 2011-10-13 LAB — CBC
HCT: 31.5 % — ABNORMAL LOW (ref 36.0–46.0)
Hemoglobin: 10.4 g/dL — ABNORMAL LOW (ref 12.0–15.0)
MCHC: 33 g/dL (ref 30.0–36.0)
WBC: 8.6 10*3/uL (ref 4.0–10.5)

## 2011-10-13 LAB — BASIC METABOLIC PANEL
CO2: 24 mEq/L (ref 19–32)
Calcium: 8.3 mg/dL — ABNORMAL LOW (ref 8.4–10.5)
Creatinine, Ser: 0.77 mg/dL (ref 0.50–1.10)
GFR calc Af Amer: >90 mL/min (ref >90)
GFR calc non Af Amer: 83 mL/min — ABNORMAL LOW (ref >90)
Potassium: 4.2 mEq/L (ref 3.5–5.1)

## 2011-10-13 MED FILL — Hydromorphone HCl Inj 1 MG/ML: INTRAMUSCULAR | Qty: 1 | Status: AC

## 2011-10-13 NOTE — Discharge Summary (Signed)
Agree with above  [name] 

## 2011-10-13 NOTE — Discharge Summary (Signed)
[facility] Discharge Summary   Patient ID:  [name] MRN: [id number] DOB/AGE: 72-Apr-1942 72 y.o.  Admit date: 10/12/2011 Discharge date: 10/13/2011 Date of Surgery: 10/12/2011 Surgeon: Surgeon(s): Nada Libman, MD  Admission Diagnosis: Right internal carotid artery stenosis   Discharge Diagnoses:  Right internal carotid artery stenosis   Secondary Diagnoses: Past Medical History  Diagnosis Date  . Carotid artery occlusion   . Anemia   . Coronary artery disease   . Myocardial infarction 2007  . Hypertension   . Heart murmur   . Recurrent upper respiratory infection (URI)     March took antibiotics  . Cancer     skin cancer removed    Procedure(s): ENDARTERECTOMY CAROTID  Discharged Condition: good  HPI:  [name] is a 71 y.o. female who  has a known asymptomatic greater than 80% right side a stenosis and approximately 60-80% left sided stenosis.  She is been sent cardiac evaluation that she has a history of multiple coronary stents.  she has remained asymptomatic. She denies slurred speech. She denies amaurosis fugax. She denies numbness or weakness in either extremity. She was admitted for right carotid endarterectomy. Pt known to have flattening around right side of mouth.  CT angiogram which reveals a high-grade right carotid stenosis as well as significant atherosclerotic changes to her aortic arch and proximal common carotid artery.    Hospital Course:  [name] is a 71 y.o. female is S/P Right Procedure(s): ENDARTERECTOMY CAROTID Extubated: POD # 0 Post-op wounds healing well Pt. Ambulating, voiding and taking PO diet without difficulty. Pt pain controlled with PO pain meds. Labs as below Complications:none  Consults:     Significant Diagnostic Studies: CBC Lab Results  Component Value Date   WBC 8.6 10/13/2011   HGB 10.4* 10/13/2011   HCT 31.5* 10/13/2011   MCV 92.6 10/13/2011   PLT 133* 10/13/2011    BMET   Component Value Date/Time   NA 137 10/13/2011 0505   K 4.2 10/13/2011 0505   CL 107 10/13/2011 0505   CO2 24 10/13/2011 0505   GLUCOSE 116* 10/13/2011 0505   BUN 11 10/13/2011 0505   CREATININE 0.77 10/13/2011 0505   CREATININE 0.85 08/31/2011 1110   CALCIUM 8.3* 10/13/2011 0505   GFRNONAA 83* 10/13/2011 0505   GFRAA >90 10/13/2011 0505   COAG Lab Results  Component Value Date   INR 1.03 10/04/2011   INR 1.07 11/26/2010     Disposition:  Discharge to :Home Discharge Orders    Future Appointments: Provider: Department: Dept Phone: Center:   10/30/2011 3:15 PM Nada Libman, MD [facility] [phone] [facility]     Future Orders Please Complete By Expires   Resume previous diet      Driving Restrictions      Comments:   No driving for 2 weeks   Lifting restrictions      Comments:   No lifting for 4 weeks   Call MD for:  temperature >100.5      Call MD for:  redness, tenderness, or signs of infection (pain, swelling, bleeding, redness, odor or green/yellow discharge around incision site)      Call MD for:  severe or increased pain, loss or decreased feeling  in affected limb(s)      Increase activity slowly      Comments:   Walk with assistance use walker or cane as needed   May shower       Scheduling Instructions:  Saturday    No dressing needed      may wash over wound with mild soap and water      CAROTID Sugery: Call MD for difficulty swallowing or speaking; weakness in arms or legs that is a new symtom; severe headache.  If you have increased swelling in the neck and/or  are having difficulty breathing, CALL 911         [name], [name]  Home Medication Instructions [id number]   Printed on:10/13/11 0747  Medication Information                    isosorbide mononitrate (IMDUR) 60 MG 24 hr tablet Take 60 mg by mouth daily.           ramipril (ALTACE) 1.25 MG capsule Take 1.25 mg by mouth daily.           metoprolol succinate (TOPROL-XL) 100 MG 24 hr tablet Take  100 mg by mouth daily. Take with or immediately following a meal.           fenofibrate 160 MG tablet Take 160 mg by mouth daily.           atorvastatin (LIPITOR) 40 MG tablet Take 40 mg by mouth daily.           amLODipine-olmesartan (AZOR) 10-40 MG per tablet Take 1 tablet by mouth daily.           diazepam (VALIUM) 5 MG tablet Take 5 mg by mouth every 6 (six) hours as needed. For anxiety           pantoprazole (PROTONIX) 40 MG tablet Take 40 mg by mouth daily as needed.            budesonide-formoterol (SYMBICORT) 160-4.5 MCG/ACT inhaler Inhale 2 puffs into the lungs 2 (two) times daily as needed. Shortness of breath           albuterol (PROVENTIL) (2.5 MG/3ML) 0.083% nebulizer solution Take 2.5 mg by nebulization every 6 (six) hours as needed. Shortness of breath           omeprazole (PRILOSEC) 20 MG capsule Take 20 mg by mouth daily.           clopidogrel (PLAVIX) 75 MG tablet Take 75 mg by mouth daily.           aspirin EC 81 MG tablet Take 162 mg by mouth daily.           oxyCODONE-acetaminophen (ROXICET) 5-325 MG per tablet Take 1 tablet by mouth every 4 (four) hours as needed for pain.            Verbal and written Discharge instructions given to the patient. Wound care per Discharge AVS Follow-up Information    Follow up with Myra Gianotti IV, Lala Lund, MD in 2 weeks. (office will arrange - sent)    Contact information:   [address] [place] [address] [phone]          Signed: Marlowe Shores 10/13/2011, 7:47 AM

## 2011-10-13 NOTE — Progress Notes (Signed)
Patient being discharged home today. Have reviewed all discharge instructions with patient as well as with husband and daughter. No questions or concerns were voiced at this time. Patient did receive a pain pill before leaving due to the long ride home.

## 2011-10-13 NOTE — Progress Notes (Signed)
UR Completed.  [name] Jane [phone] 10/13/2011  

## 2011-10-13 NOTE — Progress Notes (Signed)
[facility] POST - OP CEA PROGRESS NOTE  Date of Surgery: 10/12/2011 Surgeon: Surgeon(s): Nada Libman, MD 1 Day Post-Op right Carotid Endarterectomy .  HPI: [name] is a 71 y.o. female who is 1 Day Post-Op right Carotid Endarterectomy . Patient is doing well. Pre-operative symptoms are Unchanged Patient denies headache; Patient denies difficulty swallowing; denies weakness in upper or lower extremities; Pt. denies other symptoms of stroke or TIA.  IMAGING: No results found.  Significant Diagnostic Studies: CBC Lab Results  Component Value Date   WBC 8.6 10/13/2011   HGB 10.4* 10/13/2011   HCT 31.5* 10/13/2011   MCV 92.6 10/13/2011   PLT 133* 10/13/2011    BMET    Component Value Date/Time   NA 137 10/13/2011 0505   K 4.2 10/13/2011 0505   CL 107 10/13/2011 0505   CO2 24 10/13/2011 0505   GLUCOSE 116* 10/13/2011 0505   BUN 11 10/13/2011 0505   CREATININE 0.77 10/13/2011 0505   CREATININE 0.85 08/31/2011 1110   CALCIUM 8.3* 10/13/2011 0505   GFRNONAA 83* 10/13/2011 0505   GFRAA >90 10/13/2011 0505    COAG Lab Results  Component Value Date   INR 1.03 10/04/2011   INR 1.07 11/26/2010   No results found for this basename: PTT      Intake/Output Summary (Last 24 hours) at 10/13/11 0745 Last data filed at 10/13/11 0659  Gross per 24 hour  Intake 3398.33 ml  Output   2630 ml  Net 768.33 ml    Physical Exam:  BP Readings from Last 3 Encounters:  10/13/11 125/44  10/13/11 125/44  10/04/11 120/73   Temp Readings from Last 3 Encounters:  10/13/11 98.5 F (36.9 C) Oral  10/13/11 98.5 F (36.9 C) Oral  10/04/11 97.7 F (36.5 C)    SpO2 Readings from Last 3 Encounters:  10/13/11 90%  10/13/11 90%  10/04/11 94%   Pulse Readings from Last 3 Encounters:  10/13/11 58  10/13/11 58  10/04/11 67    Pt is A&O x 3 Gait is normal Speech is fluent right Neck Wound is healing well Patient with Negative tongue deviation and baseline facial  droop Pt has good and equal strength in all extremities  Assessment: [name] is a 71 y.o. female is S/P Right Carotid endarterectomy Pt is voiding, ambulating and taking po well   Plan: Discharge to: Home Follow-up in 2 weeks   Adelfo Diebel J [phone] 10/13/2011 7:45 AM

## 2011-10-27 ENCOUNTER — Encounter: Payer: Self-pay | Admitting: Surgery

## 2011-10-30 ENCOUNTER — Ambulatory Visit (INDEPENDENT_AMBULATORY_CARE_PROVIDER_SITE_OTHER): Payer: Medicare Other | Admitting: Surgery

## 2011-10-30 ENCOUNTER — Encounter: Payer: Self-pay | Admitting: Surgery

## 2011-10-30 VITALS — BP 153/75 | HR 72 | Temp 98.2°F | Ht 61.0 in | Wt 147.0 lb

## 2011-10-30 DIAGNOSIS — I6529 Occlusion and stenosis of unspecified carotid artery: Secondary | ICD-10-CM

## 2011-10-30 DIAGNOSIS — Z48812 Encounter for surgical aftercare following surgery on the circulatory system: Secondary | ICD-10-CM

## 2011-10-30 NOTE — Progress Notes (Signed)
The patient comes back for her first postoperative visit. She is status post right carotid endarterectomy with patch angioplasty, performed on 10/12/2011 for asymptomatic right carotid stenosis. Approximately 80% stenosis was identified. She was found to have chronic thrombus within a very ulcerated irregular plaque at her carotid bifurcation. Her postoperative course has been uncomplicated. She was discharged the day following her operation. She is back to her regular activities. She has no neurologic compromise. There is a slight marginal mandibular neurapraxia which is asymptomatic. I have reassured her that this will resolve over the next 4-6 months.  Her incision is healing nicely  I will plan on seeing the patient back in 6 months with a carotid ultrasound. She has 60-80% left carotid stenosis which will need to be reevaluated, in addition to examining her operative site.

## 2011-10-30 NOTE — Progress Notes (Signed)
Addended by: Sharee Pimple on: 10/30/2011 02:29 PM   Modules accepted: Orders

## 2012-04-26 ENCOUNTER — Encounter: Payer: Self-pay | Admitting: Neurosurgery

## 2012-04-29 ENCOUNTER — Ambulatory Visit (INDEPENDENT_AMBULATORY_CARE_PROVIDER_SITE_OTHER): Payer: Medicare Other | Admitting: Neurosurgery

## 2012-04-29 ENCOUNTER — Encounter: Payer: Self-pay | Admitting: Neurosurgery

## 2012-04-29 ENCOUNTER — Other Ambulatory Visit (INDEPENDENT_AMBULATORY_CARE_PROVIDER_SITE_OTHER): Payer: Medicare Other | Admitting: *Deleted

## 2012-04-29 VITALS — BP 151/88 | HR 74 | Resp 16 | Ht 61.0 in | Wt 144.2 lb

## 2012-04-29 DIAGNOSIS — Z48812 Encounter for surgical aftercare following surgery on the circulatory system: Secondary | ICD-10-CM

## 2012-04-29 DIAGNOSIS — I6529 Occlusion and stenosis of unspecified carotid artery: Secondary | ICD-10-CM

## 2012-04-29 NOTE — Progress Notes (Signed)
[facility] [place] Carotid Office Note  CC: Carotid surveillance Referring Physician: Brabham  History of Present Illness: 71 year old female patient of Dr. Myra Gianotti status post a right CEA in April 2013. The patient denies any signs or symptoms of CVA, TIA, amaurosis fugax or any neural deficit. The patient denies any new medical diagnoses or recent surgery.  Past Medical History  Diagnosis Date  . Carotid artery occlusion   . Anemia   . Coronary artery disease   . Myocardial infarction 2007  . Hypertension   . Heart murmur   . Recurrent upper respiratory infection (URI)     March took antibiotics  . Cancer     skin cancer removed    ROS: [x]  Positive   [ ]  Denies    General: [ ]  Weight loss, [ ]  Fever, [ ]  chills Neurologic: [ ]  Dizziness, [ ]  Blackouts, [ ]  Seizure [ ]  Stroke, [ ]  "Mini stroke", [ ]  Slurred speech, [ ]  Temporary blindness; [ ]  weakness in arms or legs, [ ]  Hoarseness Cardiac: [ ]  Chest pain/pressure, [ ]  Shortness of breath at rest [ ]  Shortness of breath with exertion, [ ]  Atrial fibrillation or irregular heartbeat Vascular: [ ]  Pain in legs with walking, [ ]  Pain in legs at rest, [ ]  Pain in legs at night,  [ ]  Non-healing ulcer, [ ]  Blood clot in vein/DVT,   Pulmonary: [ ]  Home oxygen, [ ]  Productive cough, [ ]  Coughing up blood, [ ]  Asthma,  [ ]  Wheezing Musculoskeletal:  [ ]  Arthritis, [ ]  Low back pain, [ ]  Joint pain Hematologic: [ ]  Easy Bruising, [ ]  Anemia; [ ]  Hepatitis Gastrointestinal: [ ]  Blood in stool, [ ]  Gastroesophageal Reflux/heartburn, [ ]  Trouble swallowing Urinary: [ ]  chronic Kidney disease, [ ]  on HD - [ ]  MWF or [ ]  TTHS, [ ]  Burning with urination, [ ]  Difficulty urinating Skin: [ ]  Rashes, [ ]  Wounds Psychological: [ ]  Anxiety, [ ]  Depression   Social History History  Substance Use Topics  . Smoking status: Current Some Day Smoker -- 0.2 packs/day for 40 years    Types: Cigarettes  . Smokeless tobacco:  Never Used     Comment: pt states that she is trying and has cut down tremedously  . Alcohol Use: No    Family History Family History  Problem Relation Age of Onset  . Cancer Mother   . Heart disease Mother   . Heart disease Father   . Cancer Sister   . Heart disease Sister   . Cancer Brother   . Heart disease Brother   . Cancer Son   . Heart disease Sister   . Heart disease Sister   . Anesthesia problems Neg Hx   . Hypotension Neg Hx   . Malignant hyperthermia Neg Hx   . Pseudochol deficiency Neg Hx     Allergies  Allergen Reactions  . Avelox (Moxifloxacin Hcl In Nacl) Other (See Comments)    unknown    Current Outpatient Prescriptions  Medication Sig Dispense Refill  . albuterol (PROVENTIL) (2.5 MG/3ML) 0.083% nebulizer solution Take 2.5 mg by nebulization every 6 (six) hours as needed. Shortness of breath      . amLODipine-olmesartan (AZOR) 10-40 MG per tablet Take 1 tablet by mouth daily.      Marland Kitchen aspirin EC 81 MG tablet Take 162 mg by mouth daily.      Marland Kitchen atorvastatin (LIPITOR) 40 MG tablet Take 40 mg  by mouth daily.      . budesonide-formoterol (SYMBICORT) 160-4.5 MCG/ACT inhaler Inhale 2 puffs into the lungs 2 (two) times daily as needed. Shortness of breath      . clopidogrel (PLAVIX) 75 MG tablet Take 75 mg by mouth daily.      . diazepam (VALIUM) 5 MG tablet Take 5 mg by mouth every 6 (six) hours as needed. For anxiety      . fenofibrate 160 MG tablet Take 160 mg by mouth daily.      . isosorbide mononitrate (IMDUR) 60 MG 24 hr tablet Take 60 mg by mouth daily.      . metoprolol succinate (TOPROL-XL) 100 MG 24 hr tablet Take 100 mg by mouth daily. Take with or immediately following a meal.      . omeprazole (PRILOSEC) 20 MG capsule Take 20 mg by mouth daily.      . pantoprazole (PROTONIX) 40 MG tablet Take 40 mg by mouth daily as needed.       . ramipril (ALTACE) 1.25 MG capsule Take 1.25 mg by mouth daily.        Physical Examination  There were no vitals  filed for this visit.  There is no height or weight on file to calculate BMI.  General:  WDWN in NAD Gait: Normal HEENT: WNL Eyes: Pupils equal Pulmonary: normal non-labored breathing , without Rales, rhonchi,  wheezing Cardiac: RRR, without  Murmurs, rubs or gallops; Abdomen: soft, NT, no masses Skin: no rashes, ulcers noted  Vascular Exam Pulses: 3+ radial pulses bilaterally Carotid bruits: Carotid pulses to auscultation no bruits are heard Extremities without ischemic changes, no Gangrene , no cellulitis; no open wounds;  Musculoskeletal: no muscle wasting or atrophy   Neurologic: A&O X 3; Appropriate Affect ; SENSATION: normal; MOTOR FUNCTION:  moving all extremities equally. Speech is fluent/normal  Non-Invasive Vascular Imaging CAROTID DUPLEX 04/29/2012  Right ICA 0 - 19% stenosis Left ICA 20 - 39 % stenosis   ASSESSMENT/PLAN: Asymptomatic patient 6 months status post right CEA doing well. The patient will followup in 6 months per protocol for repeat carotid duplex and then will be move to yearly. The patient's questions were encouraged and answered, she is in agreement with this plan.  Lauree Chandler ANP   Clinic MD: Myra Gianotti

## 2012-05-01 NOTE — Addendum Note (Signed)
Addended by: Sharee Pimple on: 05/01/2012 09:39 AM   Modules accepted: Orders

## 2012-07-26 IMAGING — CR DG CHEST 2V
2 series · 2 of 2 positions shown · non-contrast
Comparison: 10/11/2006

CLINICAL DATA: Chest pain over the sternal region.

CHEST - 2 VIEW

[w chest pa]
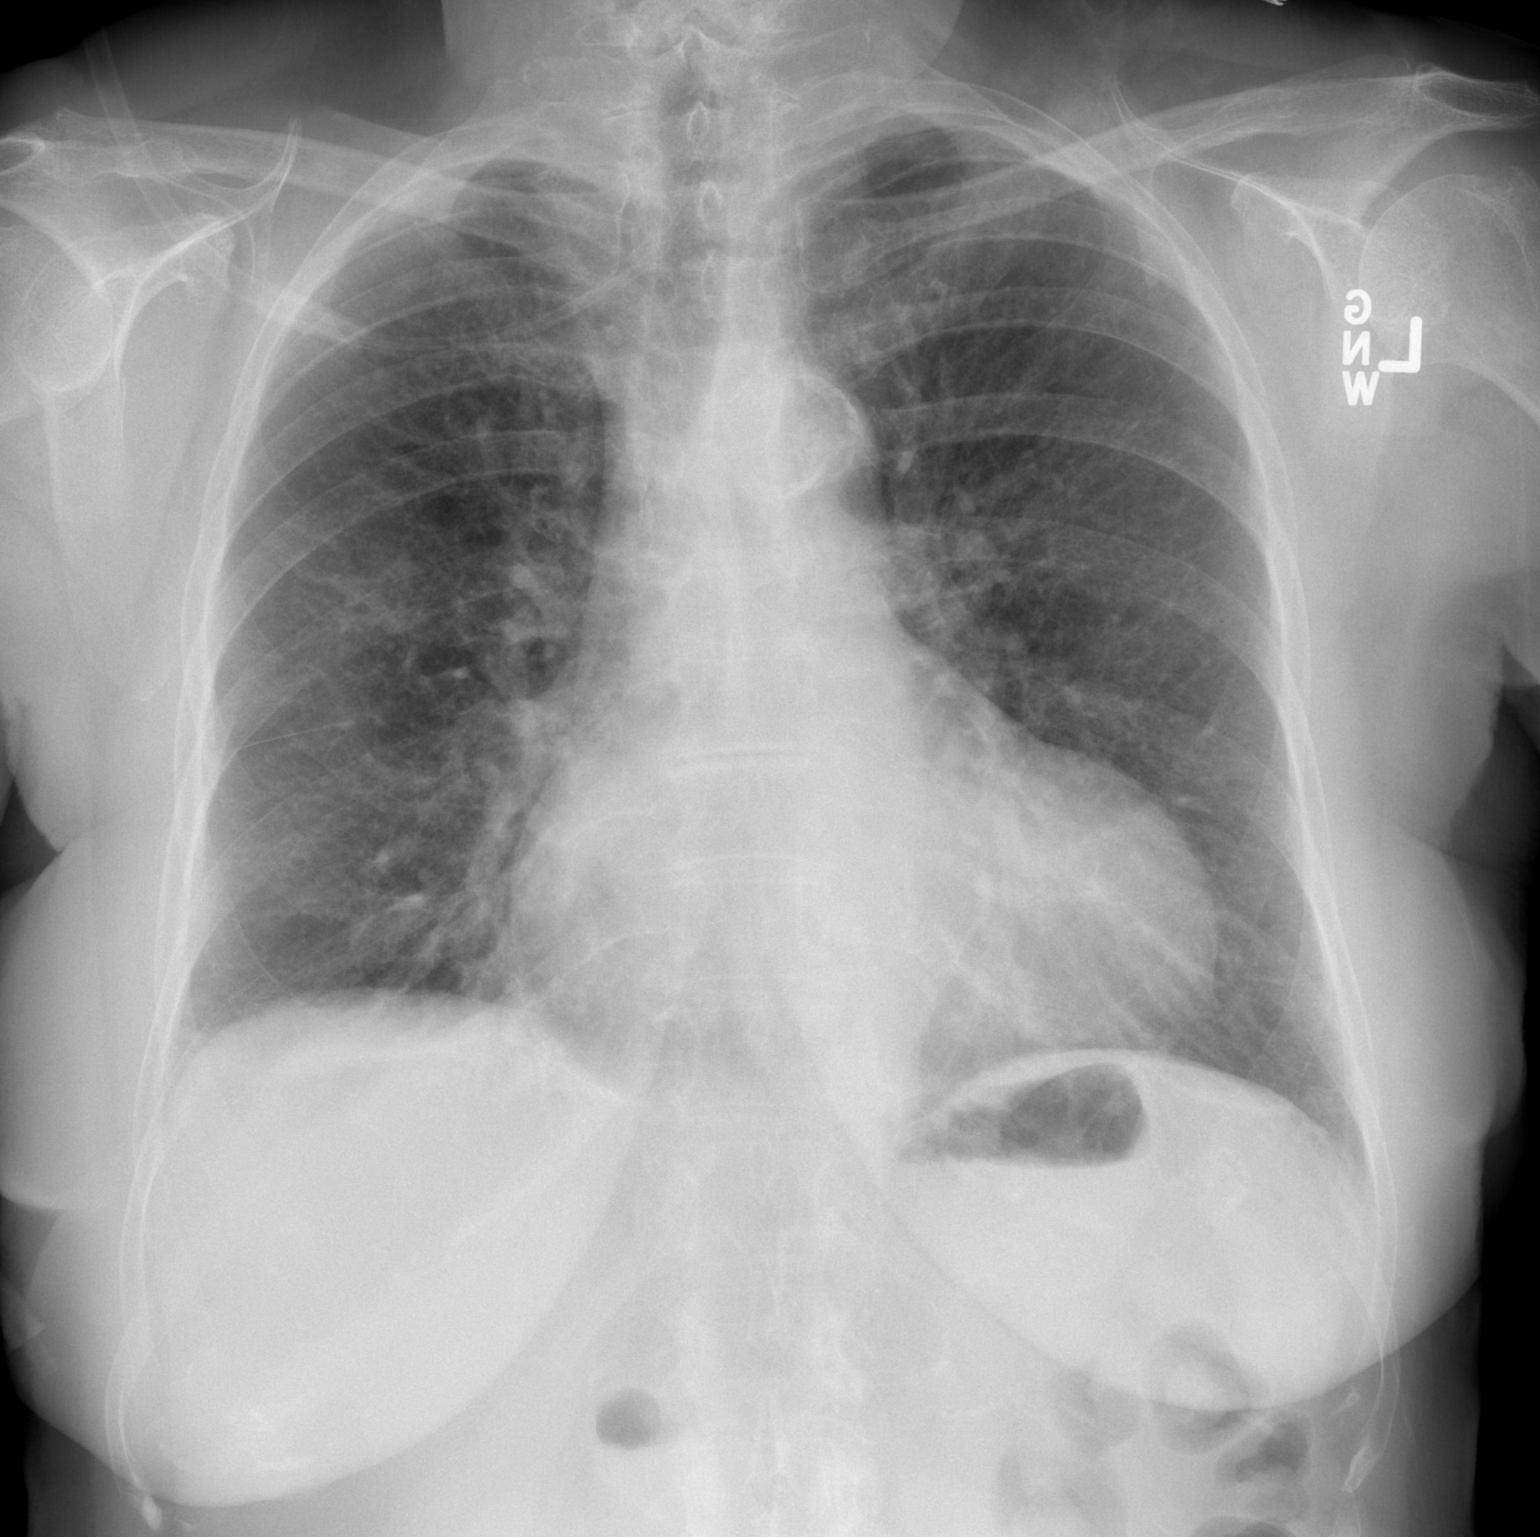

[w chest lat]
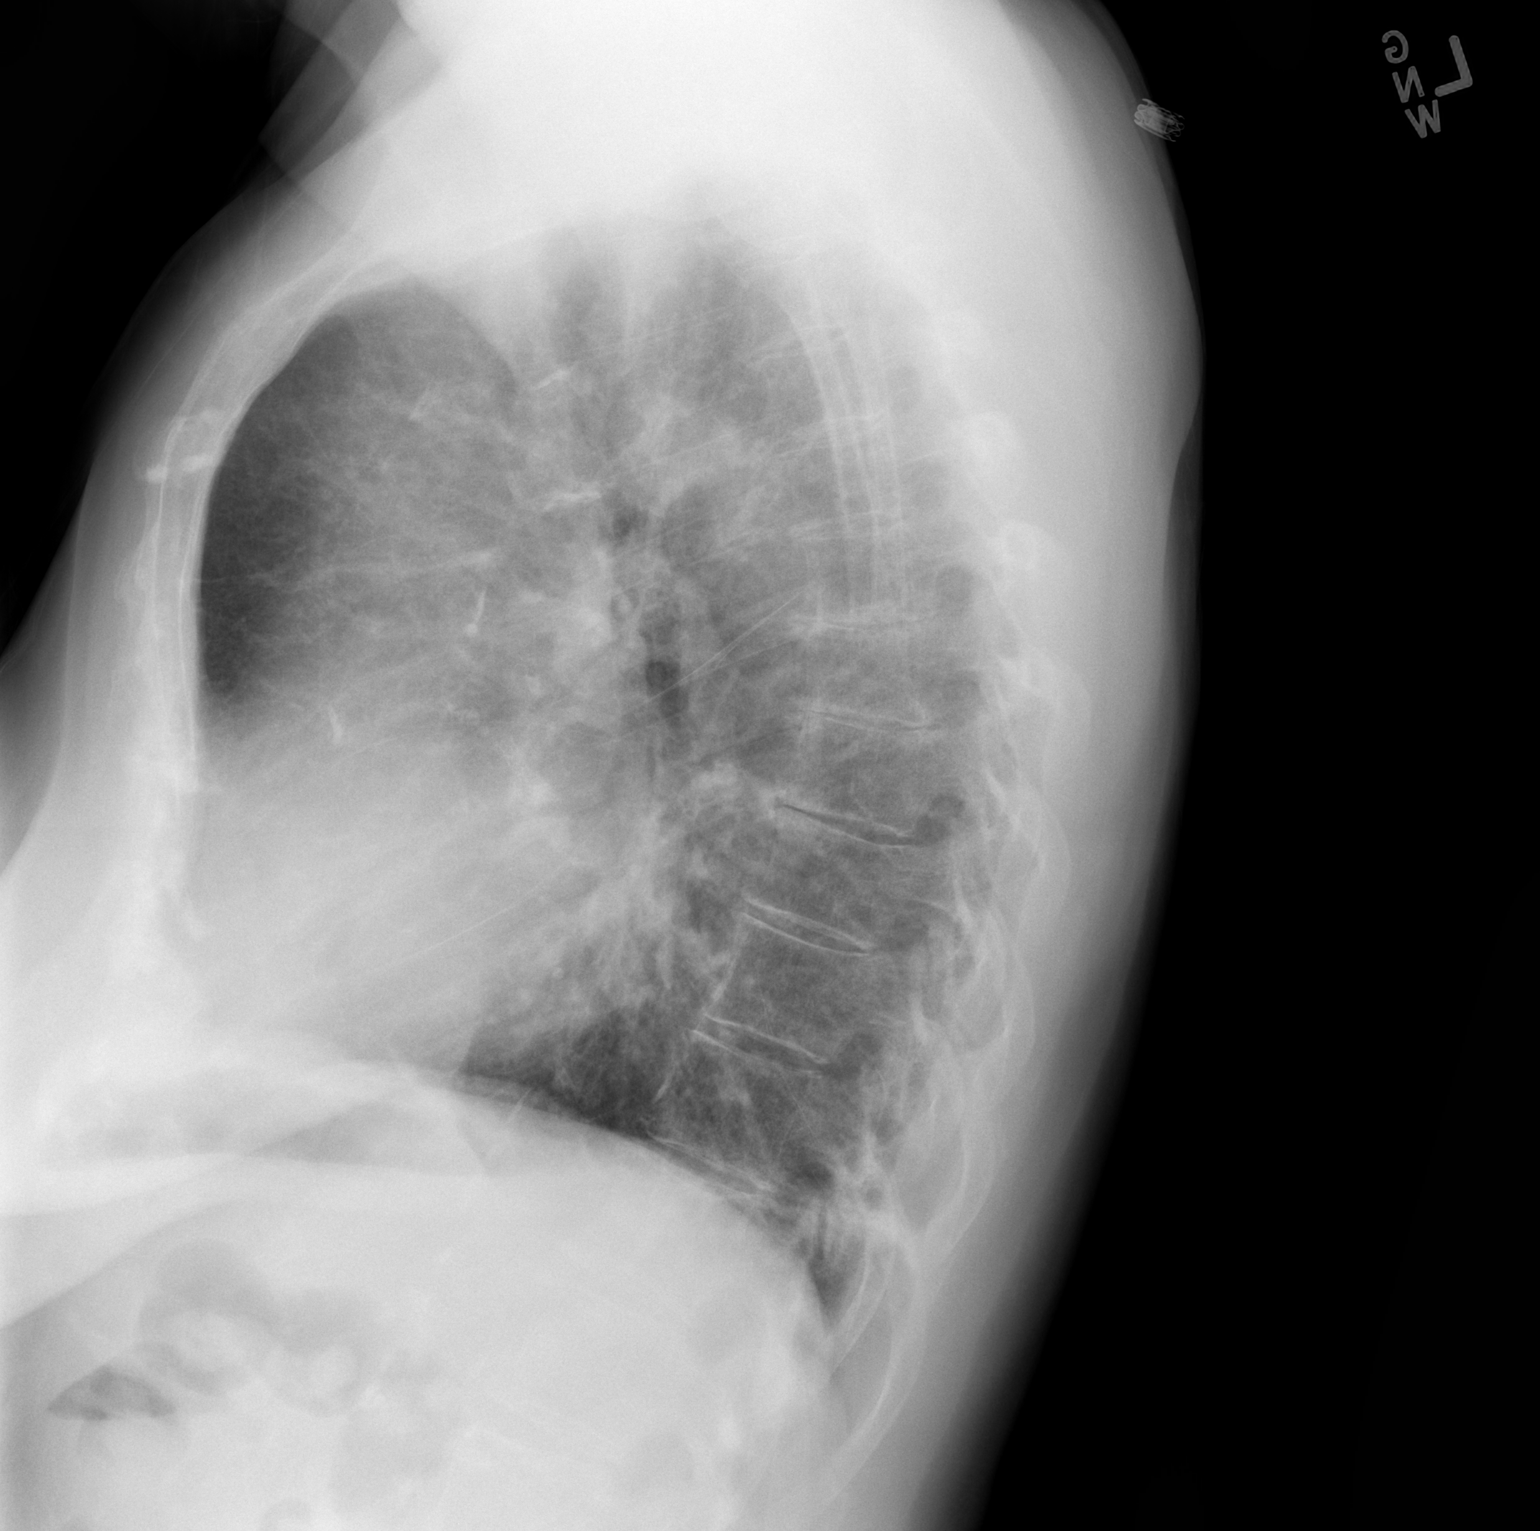

[2 of 2 positions shown; findings below may reference images not displayed]

FINDINGS: Cardiac enlargement with normal pulmonary vascularity.
Slight fibrosis in the lung bases.  No pleural effusions.  No focal
airspace consolidation.  Calcified and tortuous aorta.
Emphysematous changes.  No sternal depression.  Mild degenerative
changes in the spine.  No significant changes since the prior
study.
IMPRESSION: Mild cardiac enlargement.  No evidence of active pulmonary disease.

## 2012-10-28 ENCOUNTER — Ambulatory Visit: Payer: Medicare Other | Admitting: Neurosurgery

## 2012-10-28 ENCOUNTER — Other Ambulatory Visit: Payer: Medicare Other

## 2012-11-13 ENCOUNTER — Other Ambulatory Visit (INDEPENDENT_AMBULATORY_CARE_PROVIDER_SITE_OTHER): Payer: Medicare Other | Admitting: *Deleted

## 2012-11-13 ENCOUNTER — Other Ambulatory Visit: Payer: Self-pay | Admitting: *Deleted

## 2012-11-13 DIAGNOSIS — Z48812 Encounter for surgical aftercare following surgery on the circulatory system: Secondary | ICD-10-CM

## 2012-11-13 DIAGNOSIS — I6529 Occlusion and stenosis of unspecified carotid artery: Secondary | ICD-10-CM

## 2012-11-15 ENCOUNTER — Encounter: Payer: Self-pay | Admitting: Vascular Surgery

## 2013-07-28 ENCOUNTER — Other Ambulatory Visit: Payer: Self-pay | Admitting: Vascular Surgery

## 2013-07-28 DIAGNOSIS — Z48812 Encounter for surgical aftercare following surgery on the circulatory system: Secondary | ICD-10-CM

## 2013-07-28 DIAGNOSIS — I6529 Occlusion and stenosis of unspecified carotid artery: Secondary | ICD-10-CM

## 2013-09-03 ENCOUNTER — Other Ambulatory Visit (HOSPITAL_COMMUNITY): Payer: Self-pay | Admitting: Cardiology

## 2013-09-03 DIAGNOSIS — R079 Chest pain, unspecified: Secondary | ICD-10-CM

## 2013-09-10 ENCOUNTER — Encounter (HOSPITAL_COMMUNITY)
Admission: RE | Admit: 2013-09-10 | Discharge: 2013-09-10 | Disposition: A | Discharge status: Home / Self Care | Payer: Medicare Other | Source: Ambulatory Visit | Attending: Cardiology | Admitting: Cardiology

## 2013-09-10 ENCOUNTER — Other Ambulatory Visit: Payer: Self-pay

## 2013-09-10 ENCOUNTER — Ambulatory Visit (HOSPITAL_COMMUNITY)
Admission: RE | Admit: 2013-09-10 | Discharge: 2013-09-10 | Disposition: A | Discharge status: Home / Self Care | Payer: Medicare Other | Source: Ambulatory Visit | Attending: Cardiology | Admitting: Cardiology

## 2013-09-10 DIAGNOSIS — R079 Chest pain, unspecified: Secondary | ICD-10-CM

## 2013-09-10 MED ORDER — TECHNETIUM TC 99M SESTAMIBI GENERIC - CARDIOLITE
10.0000 | Freq: Once | INTRAVENOUS | Status: AC | PRN
  Administered 2013-09-10: 10 via INTRAVENOUS

## 2013-09-10 MED ORDER — REGADENOSON 0.4 MG/5ML IV SOLN
INTRAVENOUS | Status: AC
  Administered 2013-09-10: 0.4 mg via INTRAVENOUS
  Filled 2013-09-10: qty 5

## 2013-09-10 MED ORDER — TECHNETIUM TC 99M SESTAMIBI GENERIC - CARDIOLITE
30.0000 | Freq: Once | INTRAVENOUS | Status: AC | PRN
  Administered 2013-09-10: 30 via INTRAVENOUS

## 2013-09-10 MED ORDER — REGADENOSON 0.4 MG/5ML IV SOLN
0.4000 mg | Freq: Once | INTRAVENOUS | Status: AC
  Administered 2013-09-10: 0.4 mg via INTRAVENOUS

## 2013-09-12 ENCOUNTER — Encounter (HOSPITAL_COMMUNITY): Payer: Self-pay | Admitting: Pharmacy Technician

## 2013-09-16 ENCOUNTER — Encounter (HOSPITAL_COMMUNITY)
Admission: RE | Disposition: A | Discharge status: Home / Self Care | Payer: Self-pay | Source: Ambulatory Visit | Attending: Cardiology

## 2013-09-16 ENCOUNTER — Ambulatory Visit (HOSPITAL_COMMUNITY)
Admission: RE | Admit: 2013-09-16 | Discharge: 2013-09-16 | Disposition: A | Discharge status: Home / Self Care | Payer: Medicare Other | Source: Ambulatory Visit | Attending: Cardiology | Admitting: Cardiology

## 2013-09-16 DIAGNOSIS — Z9861 Coronary angioplasty status: Secondary | ICD-10-CM | POA: Insufficient documentation

## 2013-09-16 DIAGNOSIS — R0609 Other forms of dyspnea: Secondary | ICD-10-CM | POA: Insufficient documentation

## 2013-09-16 DIAGNOSIS — I252 Old myocardial infarction: Secondary | ICD-10-CM | POA: Insufficient documentation

## 2013-09-16 DIAGNOSIS — Z87891 Personal history of nicotine dependence: Secondary | ICD-10-CM | POA: Insufficient documentation

## 2013-09-16 DIAGNOSIS — I2510 Atherosclerotic heart disease of native coronary artery without angina pectoris: Secondary | ICD-10-CM | POA: Insufficient documentation

## 2013-09-16 DIAGNOSIS — Z7902 Long term (current) use of antithrombotics/antiplatelets: Secondary | ICD-10-CM | POA: Insufficient documentation

## 2013-09-16 DIAGNOSIS — F411 Generalized anxiety disorder: Secondary | ICD-10-CM | POA: Insufficient documentation

## 2013-09-16 DIAGNOSIS — R0989 Other specified symptoms and signs involving the circulatory and respiratory systems: Principal | ICD-10-CM | POA: Insufficient documentation

## 2013-09-16 DIAGNOSIS — E7800 Pure hypercholesterolemia, unspecified: Secondary | ICD-10-CM | POA: Insufficient documentation

## 2013-09-16 DIAGNOSIS — E119 Type 2 diabetes mellitus without complications: Secondary | ICD-10-CM | POA: Insufficient documentation

## 2013-09-16 DIAGNOSIS — I10 Essential (primary) hypertension: Secondary | ICD-10-CM | POA: Insufficient documentation

## 2013-09-16 DIAGNOSIS — M1990 Unspecified osteoarthritis, unspecified site: Secondary | ICD-10-CM | POA: Insufficient documentation

## 2013-09-16 HISTORY — PX: LEFT HEART CATHETERIZATION WITH CORONARY ANGIOGRAM: SHX5451

## 2013-09-16 SURGERY — LEFT HEART CATHETERIZATION WITH CORONARY ANGIOGRAM
Anesthesia: LOCAL

## 2013-09-16 MED ORDER — HEPARIN (PORCINE) IN NACL 2-0.9 UNIT/ML-% IJ SOLN
INTRAMUSCULAR | Status: AC
  Filled 2013-09-16: qty 1000

## 2013-09-16 MED ORDER — SODIUM CHLORIDE 0.9 % IJ SOLN: 3.0000 mL | INTRAMUSCULAR | Status: DC | PRN

## 2013-09-16 MED ORDER — ASPIRIN 81 MG PO CHEW
81.0000 mg | CHEWABLE_TABLET | ORAL | Status: AC
  Administered 2013-09-16: 81 mg via ORAL
  Filled 2013-09-16: qty 1

## 2013-09-16 MED ORDER — SODIUM CHLORIDE 0.9 % IV SOLN
INTRAVENOUS | Status: DC
  Administered 2013-09-16: 08:00:00 via INTRAVENOUS

## 2013-09-16 MED ORDER — SODIUM CHLORIDE 0.9 % IJ SOLN: 3.0000 mL | Freq: Two times a day (BID) | INTRAMUSCULAR | Status: DC

## 2013-09-16 MED ORDER — NITROGLYCERIN 0.2 MG/ML ON CALL CATH LAB
INTRAVENOUS | Status: AC
  Filled 2013-09-16: qty 1

## 2013-09-16 MED ORDER — SODIUM CHLORIDE 0.9 % IV SOLN: INTRAVENOUS | Status: DC

## 2013-09-16 MED ORDER — DIAZEPAM 5 MG PO TABS
5.0000 mg | ORAL_TABLET | ORAL | Status: AC
  Administered 2013-09-16: 5 mg via ORAL
  Filled 2013-09-16: qty 1

## 2013-09-16 MED ORDER — LABETALOL HCL 5 MG/ML IV SOLN
INTRAVENOUS | Status: AC
  Filled 2013-09-16: qty 4

## 2013-09-16 MED ORDER — SODIUM CHLORIDE 0.9 % IV SOLN: 250.0000 mL | INTRAVENOUS | Status: DC | PRN

## 2013-09-16 MED ORDER — LIDOCAINE HCL (PF) 1 % IJ SOLN
INTRAMUSCULAR | Status: AC
  Filled 2013-09-16: qty 30

## 2013-09-16 NOTE — CV Procedure (Signed)
Left cardiac cath  report dictated on 09/16/2013 dictation number is [phone]

## 2013-09-16 NOTE — Discharge Instructions (Signed)
Angiography, Care After °Refer to this sheet in the next few weeks. These instructions provide you with information on caring for yourself after your procedure. Your health care provider may also give you more specific instructions. Your treatment has been planned according to current medical practices, but problems sometimes occur. Call your health care provider if you have any problems or questions after your procedure.  °WHAT TO EXPECT AFTER THE PROCEDURE °After your procedure, it is typical to have the following sensations: °· Minor discomfort or tenderness and a small bump at the catheter insertion site. The bump should usually decrease in size and tenderness within 1 to 2 weeks. °· Any bruising will usually fade within 2 to 4 weeks. °HOME CARE INSTRUCTIONS  °· You may need to keep taking blood thinners if they were prescribed for you. Only take over-the-counter or prescription medicines for pain, fever, or discomfort as directed by your health care provider. °· Do not apply powder or lotion to the site. °· Do not sit in a bathtub, swimming pool, or whirlpool for 5 to 7 days. °· You may shower 24 hours after the procedure. Remove the bandage (dressing) and gently wash the site with plain soap and water. Gently pat the site dry. °· Inspect the site at least twice daily. °· Limit your activity for the first 48 hours. Do not bend, squat, or lift anything over 20 lb (9 kg) or as directed by your health care provider. °· Do not drive home if you are discharged the day of the procedure. Have someone else drive you. Follow instructions about when you can drive or return to work. °SEEK MEDICAL CARE IF: °· You get lightheaded when standing up. °· You have drainage (other than a small amount of blood on the dressing). °· You have chills. °· You have a fever. °· You have redness, warmth, swelling, or pain at the insertion site. °SEEK IMMEDIATE MEDICAL CARE IF:  °· You develop chest pain or shortness of breath, feel faint,  or pass out. °· You have bleeding, swelling larger than a walnut, or drainage from the catheter insertion site. °· You develop pain, discoloration, coldness, or severe bruising in the leg or arm that held the catheter. °· You develop bleeding from any other place, such as the bowels. You may see bright red blood in your urine or stools, or your stools may appear black and tarry. °· You have heavy bleeding from the site. If this happens, hold pressure on the site. °MAKE SURE YOU: °· Understand these instructions. °· Will watch your condition. °· Will get help right away if you are not doing well or get worse. °Document Released: 12/22/2004 Document Revised: 02/05/2013 Document Reviewed: 10/28/2012 °[employer]® Patient Information ©2014 [employer]. ° °

## 2013-09-16 NOTE — H&P (Signed)
  Handwritten H&P the chart needs to be scanned

## 2013-09-16 NOTE — Cardiovascular Report (Signed)
[name], [name]                ACCOUNT NO.:  [id number]  MEDICAL RECORD NO.:  [id number]  LOCATION:  [facility]                         FACILITY:  [facility]  PHYSICIAN:  Riyansh Gerstner N. Terrence Dupont, M.D. DATE OF BIRTH:  09-14-1940  DATE OF PROCEDURE:  09/16/2013 DATE OF DISCHARGE:  09/16/2013                           CARDIAC CATHETERIZATION   PROCEDURE:  Left cardiac cath with selective left and right coronary angiography, LV graphy via right groin using Judkins technique.  INDICATION FOR THE PROCEDURE:  [name] is a 73 year old female with past medical history significant for coronary artery disease, history of posterolateral wall myocardial infarction in March 2007, status post PCI to left circumflex and RCA in the past, hypertension, hypercholesteremia, history of nonsustained V-tach.  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  The patient denies any chest pain.  No nausea, vomiting, or diaphoresis. Activities limited.  Denies PND, orthopnea, or leg swelling.  Denies palpitation, lightheadedness, or syncope.  The patient underwent Lexiscan Myoview on March 25, which showed lateral wall infarct with peri-infarct ischemia with EF of 58%.  Due to new onset exertional dyspnea, multiple risk factors, probably angina equivalent and positive nuclear stress test.  Discussed with the patient regarding left cath, possible PTCA stenting, its risks and benefits i.e., death, MI, stroke, need for emergency CABG, local vascular complications et Ronney Asters and consented for PCI.  PROCEDURE IN DETAIL:  After obtaining the informed consent, the patient was brought to the cath lab and was placed on fluoroscopy table.  Right groin was prepped and draped in usual fashion.   Xylocaine 1% was used for local anesthesia in the right groin.  With the help of thin wall needle, a 6-French arterial sheath was placed.  The sheath was aspirated and flushed.  Next, 5-French left Judkins catheter was advanced over the wire under fluoroscopic guidance up to the ascending aorta.  Wire was pulled out. The catheter was aspirated and connected to the Manifold.  Catheter was further advanced and engaged into left coronary ostium.  Multiple views of the left system were taken.  Next, the catheter was disengaged and was pulled out over the wire and was replaced with 5-French right Judkins catheter, which was advanced over the wire under fluoroscopic guidance up to the ascending aorta.  Wire was pulled out.  The catheter was aspirated and connected to the Manifold.  Catheter was further advanced and engaged into right coronary ostium.  Multiple views of the right system were taken.  Next, catheter was disengaged and was pulled out over the wire and was replaced with 5-French pigtail catheter, which was advanced over the wire under fluoroscopic guidance up to the ascending aorta.  Wire was pulled out.  The catheter was aspirated and connected to the Manifold.  Catheter was further advanced across the aortic valve into the LV.  LV pressures were recorded.  Next, LV graft was done in 30-degree RAO position.  Post-angiographic pressures were recorded from LV and then pullback pressures were recorded from the aorta.  There was no gradient across the aortic valve.  Next, pigtail catheter was pulled out over the wire.  Sheaths were aspirated and flushed.  FINDINGS:  LV showed mild inferobasal wall hypokinesia, EF of 50-55%. Left main was short, which was patent.  LAD has 20-25% proximal and mid stenosis.  Diagonal 1 is moderate size, which has mild disease.  Left circumflex is patent in proximal stented segments, and has 20-30% mid focal stenosis with haziness with TIMI grade 3  distal flow.  OM1 is very, very small.  OM2 is large, which is patent.  OM3 is small, which is patent.  RCA has 20-30% proximal and mid junction stenosis just prior to the stenting.  Stented mid segment is patent.  PLV branches were very, very small and diffusely diseased.  PDA is small, which has mild disease.  The patient tolerated the procedure well.  There were no complications.  The patient was transferred to recovery room in stable condition.  The plan is to maximize antianginal medications, which have been done recently.     [name]. Terrence Dupont, M.D.     [facility]MEDQ  D:  09/16/2013  T:  09/16/2013  Job:  [id number]

## 2013-09-16 NOTE — Interval H&P Note (Signed)
Cath Lab Visit (complete for each Cath Lab visit)  Clinical Evaluation Leading to the Procedure:   ACS: no  Non-ACS:    Anginal Classification: CCS III  Anti-ischemic medical therapy: Maximal Therapy (2 or more classes of medications)  Non-Invasive Test Results: Intermediate-risk stress test findings: cardiac mortality 1-3%/year  Prior CABG: No previous CABG      History and Physical Interval Note:  09/16/2013 8:50 AM  [name]  has presented today for surgery, with the diagnosis of c/p  The various methods of treatment have been discussed with the patient and family. After consideration of risks, benefits and other options for treatment, the patient has consented to  Procedure(s): LEFT HEART CATHETERIZATION WITH CORONARY ANGIOGRAM (N/A) as a surgical intervention .  The patient's history has been reviewed, patient examined, no change in status, stable for surgery.  I have reviewed the patient's chart and labs.  Questions were answered to the patient's satisfaction.     Clent Demark

## 2013-11-17 ENCOUNTER — Ambulatory Visit (HOSPITAL_COMMUNITY): Payer: Medicare Other

## 2013-11-17 ENCOUNTER — Other Ambulatory Visit (HOSPITAL_COMMUNITY): Payer: Medicare Other

## 2013-11-17 ENCOUNTER — Ambulatory Visit: Payer: Medicare Other | Admitting: Family

## 2013-11-17 ENCOUNTER — Ambulatory Visit: Payer: Medicare Other | Admitting: Surgery

## 2013-11-28 ENCOUNTER — Other Ambulatory Visit (HOSPITAL_COMMUNITY): Payer: Medicare Other

## 2013-11-28 ENCOUNTER — Ambulatory Visit: Payer: Medicare Other | Admitting: Family

## 2013-12-11 ENCOUNTER — Encounter: Payer: Self-pay | Admitting: Family

## 2013-12-12 ENCOUNTER — Ambulatory Visit: Payer: Medicare Other | Admitting: Family

## 2013-12-12 ENCOUNTER — Other Ambulatory Visit (HOSPITAL_COMMUNITY): Payer: Medicare Other

## 2014-03-06 ENCOUNTER — Encounter: Payer: Self-pay | Admitting: Family

## 2014-03-09 ENCOUNTER — Encounter: Payer: Self-pay | Admitting: Family

## 2014-03-09 ENCOUNTER — Ambulatory Visit (INDEPENDENT_AMBULATORY_CARE_PROVIDER_SITE_OTHER): Payer: Medicare Other | Admitting: Family

## 2014-03-09 ENCOUNTER — Ambulatory Visit (HOSPITAL_COMMUNITY)
Admission: RE | Admit: 2014-03-09 | Discharge: 2014-03-09 | Disposition: A | Discharge status: Home / Self Care | Payer: Medicare Other | Source: Ambulatory Visit | Attending: Family | Admitting: Family

## 2014-03-09 VITALS — BP 174/83 | HR 74 | Resp 20 | Ht 60.25 in | Wt 141.4 lb

## 2014-03-09 DIAGNOSIS — I6529 Occlusion and stenosis of unspecified carotid artery: Secondary | ICD-10-CM | POA: Diagnosis present

## 2014-03-09 DIAGNOSIS — Z48812 Encounter for surgical aftercare following surgery on the circulatory system: Secondary | ICD-10-CM | POA: Diagnosis not present

## 2014-03-09 NOTE — Patient Instructions (Signed)
Stroke Prevention Some medical conditions and behaviors are associated with an increased chance of having a stroke. You may prevent a stroke by making healthy choices and managing medical conditions. HOW CAN I REDUCE MY RISK OF HAVING A STROKE?   Stay physically active. Get at least 30 minutes of activity on most or all days.  Do not smoke. It may also be helpful to avoid exposure to secondhand smoke.  Limit alcohol use. Moderate alcohol use is considered to be:  No more than 2 drinks per day for men.  No more than 1 drink per day for nonpregnant women.  Eat healthy foods. This involves:  Eating 5 or more servings of fruits and vegetables a day.  Making dietary changes that address high blood pressure (hypertension), high cholesterol, diabetes, or obesity.  Manage your cholesterol levels.  Making food choices that are high in fiber and low in saturated fat, trans fat, and cholesterol may control cholesterol levels.  Take any prescribed medicines to control cholesterol as directed by your health care provider.  Manage your diabetes.  Controlling your carbohydrate and sugar intake is recommended to manage diabetes.  Take any prescribed medicines to control diabetes as directed by your health care provider.  Control your hypertension.  Making food choices that are low in salt (sodium), saturated fat, trans fat, and cholesterol is recommended to manage hypertension.  Take any prescribed medicines to control hypertension as directed by your health care provider.  Maintain a healthy weight.  Reducing calorie intake and making food choices that are low in sodium, saturated fat, trans fat, and cholesterol are recommended to manage weight.  Stop drug abuse.  Avoid taking birth control pills.  Talk to your health care provider about the risks of taking birth control pills if you are over 35 years old, smoke, get migraines, or have ever had a blood clot.  Get evaluated for sleep  disorders (sleep apnea).  Talk to your health care provider about getting a sleep evaluation if you snore a lot or have excessive sleepiness.  Take medicines only as directed by your health care provider.  For some people, aspirin or blood thinners (anticoagulants) are helpful in reducing the risk of forming abnormal blood clots that can lead to stroke. If you have the irregular heart rhythm of atrial fibrillation, you should be on a blood thinner unless there is a good reason you cannot take them.  Understand all your medicine instructions.  Make sure that other conditions (such as anemia or atherosclerosis) are addressed. SEEK IMMEDIATE MEDICAL CARE IF:   You have sudden weakness or numbness of the face, arm, or leg, especially on one side of the body.  Your face or eyelid droops to one side.  You have sudden confusion.  You have trouble speaking (aphasia) or understanding.  You have sudden trouble seeing in one or both eyes.  You have sudden trouble walking.  You have dizziness.  You have a loss of balance or coordination.  You have a sudden, severe headache with no known cause.  You have new chest pain or an irregular heartbeat. Any of these symptoms may represent a serious problem that is an emergency. Do not wait to see if the symptoms will go away. Get medical help at once. Call your local emergency services (911 in [place] Do not drive yourself to the hospital. Document Released: 07/13/2004 Document Revised: 10/20/2013 Document Reviewed: 12/06/2012 Exit[employer] Patient Information 2015 [employer]. This information is not intended to replace advice given   to you by your health care provider. Make sure you discuss any questions you have with your health care provider.   Smoking Cessation Quitting smoking is important to your health and has many advantages. However, it is not always easy to quit since nicotine is a very addictive drug. Oftentimes, people try 3 times or more  before being able to quit. This document explains the best ways for you to prepare to quit smoking. Quitting takes hard work and a lot of effort, but you can do it. ADVANTAGES OF QUITTING SMOKING  You will live longer, feel better, and live better.  Your body will feel the impact of quitting smoking almost immediately.  Within 20 minutes, blood pressure decreases. Your pulse returns to its normal level.  After 8 hours, carbon monoxide levels in the blood return to normal. Your oxygen level increases.  After 24 hours, the chance of having a heart attack starts to decrease. Your breath, hair, and body stop smelling like smoke.  After 48 hours, damaged nerve endings begin to recover. Your sense of taste and smell improve.  After 72 hours, the body is virtually free of nicotine. Your bronchial tubes relax and breathing becomes easier.  After 2 to 12 weeks, lungs can hold more air. Exercise becomes easier and circulation improves.  The risk of having a heart attack, stroke, cancer, or lung disease is greatly reduced.  After 1 year, the risk of coronary heart disease is cut in half.  After 5 years, the risk of stroke falls to the same as a nonsmoker.  After 10 years, the risk of lung cancer is cut in half and the risk of other cancers decreases significantly.  After 15 years, the risk of coronary heart disease drops, usually to the level of a nonsmoker.  If you are pregnant, quitting smoking will improve your chances of having a healthy baby.  The people you live with, especially any children, will be healthier.  You will have extra money to spend on things other than cigarettes. QUESTIONS TO THINK ABOUT BEFORE ATTEMPTING TO QUIT You may want to talk about your answers with your health care provider.  Why do you want to quit?  If you tried to quit in the past, what helped and what did not?  What will be the most difficult situations for you after you quit? How will you plan to  handle them?  Who can help you through the tough times? Your family? Friends? A health care provider?  What pleasures do you get from smoking? What ways can you still get pleasure if you quit? Here are some questions to ask your health care provider:  How can you help me to be successful at quitting?  What medicine do you think would be best for me and how should I take it?  What should I do if I need more help?  What is smoking withdrawal like? How can I get information on withdrawal? GET READY  Set a quit date.  Change your environment by getting rid of all cigarettes, ashtrays, matches, and lighters in your home, car, or work. Do not let people smoke in your home.  Review your past attempts to quit. Think about what worked and what did not. GET SUPPORT AND ENCOURAGEMENT You have a better chance of being successful if you have help. You can get support in many ways.  Tell your family, friends, and coworkers that you are going to quit and need their support. Ask them not   to smoke around you.  Get individual, group, or telephone counseling and support. Programs are available at local hospitals and health centers. Call your local health department for information about programs in your area.  Spiritual beliefs and practices may help some smokers quit.  Download a "quit meter" on your computer to keep track of quit statistics, such as how long you have gone without smoking, cigarettes not smoked, and money saved.  Get a self-help book about quitting smoking and staying off tobacco. LEARN NEW SKILLS AND BEHAVIORS  Distract yourself from urges to smoke. Talk to someone, go for a walk, or occupy your time with a task.  Change your normal routine. Take a different route to work. Drink tea instead of coffee. Eat breakfast in a different place.  Reduce your stress. Take a hot bath, exercise, or read a book.  Plan something enjoyable to do every day. Reward yourself for not  smoking.  Explore interactive web-based programs that specialize in helping you quit. GET MEDICINE AND USE IT CORRECTLY Medicines can help you stop smoking and decrease the urge to smoke. Combining medicine with the above behavioral methods and support can greatly increase your chances of successfully quitting smoking.  Nicotine replacement therapy helps deliver nicotine to your body without the negative effects and risks of smoking. Nicotine replacement therapy includes nicotine gum, lozenges, inhalers, nasal sprays, and skin patches. Some may be available over-the-counter and others require a prescription.  Antidepressant medicine helps people abstain from smoking, but how this works is unknown. This medicine is available by prescription.  Nicotinic receptor partial agonist medicine simulates the effect of nicotine in your brain. This medicine is available by prescription. Ask your health care provider for advice about which medicines to use and how to use them based on your health history. Your health care provider will tell you what side effects to look out for if you choose to be on a medicine or therapy. Carefully read the information on the package. Do not use any other product containing nicotine while using a nicotine replacement product.  RELAPSE OR DIFFICULT SITUATIONS Most relapses occur within the first 3 months after quitting. Do not be discouraged if you start smoking again. Remember, most people try several times before finally quitting. You may have symptoms of withdrawal because your body is used to nicotine. You may crave cigarettes, be irritable, feel very hungry, cough often, get headaches, or have difficulty concentrating. The withdrawal symptoms are only temporary. They are strongest when you first quit, but they will go away within 10-14 days. To reduce the chances of relapse, try to:  Avoid drinking alcohol. Drinking lowers your chances of successfully quitting.  Reduce the  amount of caffeine you consume. Once you quit smoking, the amount of caffeine in your body increases and can give you symptoms, such as a rapid heartbeat, sweating, and anxiety.  Avoid smokers because they can make you want to smoke.  Do not let weight gain distract you. Many smokers will gain weight when they quit, usually less than 10 pounds. Eat a healthy diet and stay active. You can always lose the weight gained after you quit.  Find ways to improve your mood other than smoking. FOR MORE INFORMATION  www.smokefree.gov  Document Released: 05/30/2001 Document Revised: 10/20/2013 Document Reviewed: 09/14/2011 [employer] Patient Information 2015 [employer]. This information is not intended to replace advice given to you by your health care provider. Make sure you discuss any questions you have with your health care   provider.  
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
## 2014-05-28 ENCOUNTER — Encounter (HOSPITAL_COMMUNITY): Payer: Self-pay | Admitting: Cardiology

## 2015-03-15 ENCOUNTER — Other Ambulatory Visit (HOSPITAL_COMMUNITY): Payer: Medicare Other

## 2015-03-15 ENCOUNTER — Ambulatory Visit: Payer: Medicare Other | Admitting: Family

## 2015-03-17 ENCOUNTER — Encounter (HOSPITAL_COMMUNITY): Payer: Self-pay

## 2015-03-17 ENCOUNTER — Ambulatory Visit: Payer: Self-pay | Admitting: Family

## 2015-06-09 ENCOUNTER — Encounter: Payer: Self-pay | Admitting: Family

## 2015-06-17 ENCOUNTER — Ambulatory Visit: Payer: Self-pay | Admitting: Family

## 2015-06-17 ENCOUNTER — Encounter (HOSPITAL_COMMUNITY): Payer: Self-pay

## 2016-04-20 ENCOUNTER — Other Ambulatory Visit: Payer: Self-pay | Admitting: Cardiology

## 2016-04-20 DIAGNOSIS — R079 Chest pain, unspecified: Secondary | ICD-10-CM

## 2016-04-27 DIAGNOSIS — I779 Disorder of arteries and arterioles, unspecified: Secondary | ICD-10-CM | POA: Diagnosis not present

## 2016-04-27 DIAGNOSIS — M79605 Pain in left leg: Secondary | ICD-10-CM | POA: Diagnosis not present

## 2016-05-04 DIAGNOSIS — I6522 Occlusion and stenosis of left carotid artery: Secondary | ICD-10-CM | POA: Diagnosis not present

## 2016-05-04 DIAGNOSIS — R600 Localized edema: Secondary | ICD-10-CM | POA: Diagnosis not present

## 2016-05-04 DIAGNOSIS — M79605 Pain in left leg: Secondary | ICD-10-CM | POA: Diagnosis not present

## 2016-05-04 DIAGNOSIS — I779 Disorder of arteries and arterioles, unspecified: Secondary | ICD-10-CM | POA: Diagnosis not present

## 2016-05-04 DIAGNOSIS — I700 Atherosclerosis of aorta: Secondary | ICD-10-CM | POA: Diagnosis not present

## 2016-05-04 DIAGNOSIS — I6523 Occlusion and stenosis of bilateral carotid arteries: Secondary | ICD-10-CM | POA: Diagnosis not present

## 2016-05-08 ENCOUNTER — Encounter (HOSPITAL_COMMUNITY): Payer: Medicare Other

## 2016-05-10 DIAGNOSIS — I6522 Occlusion and stenosis of left carotid artery: Secondary | ICD-10-CM | POA: Diagnosis not present

## 2016-05-10 DIAGNOSIS — I878 Other specified disorders of veins: Secondary | ICD-10-CM | POA: Diagnosis not present

## 2016-05-26 ENCOUNTER — Encounter (HOSPITAL_COMMUNITY): Payer: Medicare Other

## 2016-06-06 DIAGNOSIS — I10 Essential (primary) hypertension: Secondary | ICD-10-CM | POA: Diagnosis not present

## 2016-06-06 DIAGNOSIS — E785 Hyperlipidemia, unspecified: Secondary | ICD-10-CM | POA: Diagnosis not present

## 2016-06-15 DIAGNOSIS — D692 Other nonthrombocytopenic purpura: Secondary | ICD-10-CM | POA: Diagnosis not present

## 2016-06-15 DIAGNOSIS — E785 Hyperlipidemia, unspecified: Secondary | ICD-10-CM | POA: Diagnosis not present

## 2016-06-15 DIAGNOSIS — I10 Essential (primary) hypertension: Secondary | ICD-10-CM | POA: Diagnosis not present

## 2016-06-30 DIAGNOSIS — R0609 Other forms of dyspnea: Secondary | ICD-10-CM | POA: Diagnosis not present

## 2016-06-30 DIAGNOSIS — E785 Hyperlipidemia, unspecified: Secondary | ICD-10-CM | POA: Diagnosis not present

## 2016-06-30 DIAGNOSIS — I2510 Atherosclerotic heart disease of native coronary artery without angina pectoris: Secondary | ICD-10-CM | POA: Diagnosis not present

## 2016-06-30 DIAGNOSIS — I10 Essential (primary) hypertension: Secondary | ICD-10-CM | POA: Diagnosis not present

## 2016-06-30 DIAGNOSIS — I252 Old myocardial infarction: Secondary | ICD-10-CM | POA: Diagnosis not present

## 2016-08-08 DIAGNOSIS — E784 Other hyperlipidemia: Secondary | ICD-10-CM | POA: Diagnosis not present

## 2016-08-08 DIAGNOSIS — I2510 Atherosclerotic heart disease of native coronary artery without angina pectoris: Secondary | ICD-10-CM | POA: Diagnosis not present

## 2016-08-08 DIAGNOSIS — I10 Essential (primary) hypertension: Secondary | ICD-10-CM | POA: Diagnosis not present

## 2016-08-08 DIAGNOSIS — J449 Chronic obstructive pulmonary disease, unspecified: Secondary | ICD-10-CM | POA: Diagnosis not present

## 2016-09-06 DIAGNOSIS — I2510 Atherosclerotic heart disease of native coronary artery without angina pectoris: Secondary | ICD-10-CM | POA: Diagnosis not present

## 2016-09-06 DIAGNOSIS — J449 Chronic obstructive pulmonary disease, unspecified: Secondary | ICD-10-CM | POA: Diagnosis not present

## 2016-09-06 DIAGNOSIS — I10 Essential (primary) hypertension: Secondary | ICD-10-CM | POA: Diagnosis not present

## 2016-09-06 DIAGNOSIS — E784 Other hyperlipidemia: Secondary | ICD-10-CM | POA: Diagnosis not present

## 2016-09-29 DIAGNOSIS — I10 Essential (primary) hypertension: Secondary | ICD-10-CM | POA: Diagnosis not present

## 2016-09-29 DIAGNOSIS — I252 Old myocardial infarction: Secondary | ICD-10-CM | POA: Diagnosis not present

## 2016-09-29 DIAGNOSIS — R0609 Other forms of dyspnea: Secondary | ICD-10-CM | POA: Diagnosis not present

## 2016-09-29 DIAGNOSIS — I2510 Atherosclerotic heart disease of native coronary artery without angina pectoris: Secondary | ICD-10-CM | POA: Diagnosis not present

## 2016-09-29 DIAGNOSIS — E785 Hyperlipidemia, unspecified: Secondary | ICD-10-CM | POA: Diagnosis not present

## 2016-10-02 DIAGNOSIS — I2510 Atherosclerotic heart disease of native coronary artery without angina pectoris: Secondary | ICD-10-CM | POA: Diagnosis not present

## 2016-10-02 DIAGNOSIS — J449 Chronic obstructive pulmonary disease, unspecified: Secondary | ICD-10-CM | POA: Diagnosis not present

## 2016-10-02 DIAGNOSIS — I10 Essential (primary) hypertension: Secondary | ICD-10-CM | POA: Diagnosis not present

## 2016-10-02 DIAGNOSIS — E784 Other hyperlipidemia: Secondary | ICD-10-CM | POA: Diagnosis not present

## 2016-10-04 DIAGNOSIS — I10 Essential (primary) hypertension: Secondary | ICD-10-CM | POA: Diagnosis not present

## 2016-10-04 DIAGNOSIS — G4700 Insomnia, unspecified: Secondary | ICD-10-CM | POA: Diagnosis not present

## 2016-10-04 DIAGNOSIS — E785 Hyperlipidemia, unspecified: Secondary | ICD-10-CM | POA: Diagnosis not present

## 2016-10-12 DIAGNOSIS — Z1389 Encounter for screening for other disorder: Secondary | ICD-10-CM | POA: Diagnosis not present

## 2016-10-12 DIAGNOSIS — E785 Hyperlipidemia, unspecified: Secondary | ICD-10-CM | POA: Diagnosis not present

## 2016-10-12 DIAGNOSIS — G4700 Insomnia, unspecified: Secondary | ICD-10-CM | POA: Diagnosis not present

## 2016-10-12 DIAGNOSIS — D692 Other nonthrombocytopenic purpura: Secondary | ICD-10-CM | POA: Diagnosis not present

## 2016-10-12 DIAGNOSIS — I10 Essential (primary) hypertension: Secondary | ICD-10-CM | POA: Diagnosis not present

## 2016-10-12 DIAGNOSIS — Z139 Encounter for screening, unspecified: Secondary | ICD-10-CM | POA: Diagnosis not present

## 2016-10-12 DIAGNOSIS — Z0000 Encounter for general adult medical examination without abnormal findings: Secondary | ICD-10-CM | POA: Diagnosis not present

## 2016-11-17 ENCOUNTER — Encounter (HOSPITAL_COMMUNITY)
Admission: RE | Admit: 2016-11-17 | Discharge: 2016-11-17 | Disposition: A | Discharge status: Home / Self Care | Payer: Medicare Other | Source: Ambulatory Visit | Attending: Cardiology | Admitting: Cardiology

## 2016-11-17 ENCOUNTER — Encounter (HOSPITAL_COMMUNITY): Payer: Self-pay

## 2016-11-17 DIAGNOSIS — R0609 Other forms of dyspnea: Secondary | ICD-10-CM | POA: Diagnosis not present

## 2016-11-17 DIAGNOSIS — E785 Hyperlipidemia, unspecified: Secondary | ICD-10-CM | POA: Diagnosis not present

## 2016-11-17 DIAGNOSIS — I517 Cardiomegaly: Secondary | ICD-10-CM | POA: Diagnosis not present

## 2016-11-17 DIAGNOSIS — I252 Old myocardial infarction: Secondary | ICD-10-CM | POA: Diagnosis not present

## 2016-11-17 DIAGNOSIS — Z4682 Encounter for fitting and adjustment of non-vascular catheter: Secondary | ICD-10-CM | POA: Diagnosis not present

## 2016-11-17 DIAGNOSIS — I10 Essential (primary) hypertension: Secondary | ICD-10-CM | POA: Diagnosis not present

## 2016-11-17 DIAGNOSIS — I2510 Atherosclerotic heart disease of native coronary artery without angina pectoris: Secondary | ICD-10-CM | POA: Diagnosis not present

## 2016-11-17 DIAGNOSIS — R079 Chest pain, unspecified: Secondary | ICD-10-CM | POA: Diagnosis not present

## 2016-11-17 MED ORDER — TECHNETIUM TC 99M TETROFOSMIN IV KIT
10.0000 | PACK | Freq: Once | INTRAVENOUS | Status: AC | PRN
  Administered 2016-11-17: 10 via INTRAVENOUS

## 2016-11-17 MED ORDER — REGADENOSON 0.4 MG/5ML IV SOLN
INTRAVENOUS | Status: AC
  Administered 2016-11-17: 0.4 mg via INTRAVENOUS
  Filled 2016-11-17: qty 5

## 2016-11-17 MED ORDER — REGADENOSON 0.4 MG/5ML IV SOLN
0.4000 mg | Freq: Once | INTRAVENOUS | Status: AC
  Administered 2016-11-17: 0.4 mg via INTRAVENOUS

## 2016-11-17 MED ORDER — TECHNETIUM TC 99M TETROFOSMIN IV KIT
30.0000 | PACK | Freq: Once | INTRAVENOUS | Status: AC | PRN
  Administered 2016-11-17: 30 via INTRAVENOUS

## 2016-12-07 DIAGNOSIS — E538 Deficiency of other specified B group vitamins: Secondary | ICD-10-CM | POA: Diagnosis not present

## 2016-12-07 DIAGNOSIS — Z139 Encounter for screening, unspecified: Secondary | ICD-10-CM | POA: Diagnosis not present

## 2016-12-07 DIAGNOSIS — Z1389 Encounter for screening for other disorder: Secondary | ICD-10-CM | POA: Diagnosis not present

## 2017-01-05 DIAGNOSIS — R0789 Other chest pain: Secondary | ICD-10-CM | POA: Diagnosis not present

## 2017-01-05 DIAGNOSIS — I10 Essential (primary) hypertension: Secondary | ICD-10-CM | POA: Diagnosis not present

## 2017-01-05 DIAGNOSIS — E785 Hyperlipidemia, unspecified: Secondary | ICD-10-CM | POA: Diagnosis not present

## 2017-01-05 DIAGNOSIS — I252 Old myocardial infarction: Secondary | ICD-10-CM | POA: Diagnosis not present

## 2017-01-05 DIAGNOSIS — I2510 Atherosclerotic heart disease of native coronary artery without angina pectoris: Secondary | ICD-10-CM | POA: Diagnosis not present

## 2017-02-06 DIAGNOSIS — C44529 Squamous cell carcinoma of skin of other part of trunk: Secondary | ICD-10-CM | POA: Diagnosis not present

## 2017-02-06 DIAGNOSIS — L301 Dyshidrosis [pompholyx]: Secondary | ICD-10-CM | POA: Diagnosis not present

## 2017-04-10 DIAGNOSIS — Z7189 Other specified counseling: Secondary | ICD-10-CM | POA: Diagnosis not present

## 2017-04-10 DIAGNOSIS — R5383 Other fatigue: Secondary | ICD-10-CM | POA: Diagnosis not present

## 2017-04-20 DIAGNOSIS — I25110 Atherosclerotic heart disease of native coronary artery with unstable angina pectoris: Secondary | ICD-10-CM | POA: Diagnosis not present

## 2017-04-20 DIAGNOSIS — I2510 Atherosclerotic heart disease of native coronary artery without angina pectoris: Secondary | ICD-10-CM | POA: Diagnosis not present

## 2017-04-20 DIAGNOSIS — I252 Old myocardial infarction: Secondary | ICD-10-CM | POA: Diagnosis not present

## 2017-04-20 DIAGNOSIS — I10 Essential (primary) hypertension: Secondary | ICD-10-CM | POA: Diagnosis not present

## 2017-04-20 DIAGNOSIS — I739 Peripheral vascular disease, unspecified: Secondary | ICD-10-CM | POA: Diagnosis not present

## 2017-04-20 DIAGNOSIS — E785 Hyperlipidemia, unspecified: Secondary | ICD-10-CM | POA: Diagnosis not present

## 2017-04-24 ENCOUNTER — Ambulatory Visit (HOSPITAL_COMMUNITY)
Admission: RE | Admit: 2017-04-24 | Discharge: 2017-04-24 | Disposition: A | Discharge status: Home / Self Care | Payer: Medicare Other | Source: Ambulatory Visit | Attending: Cardiology | Admitting: Cardiology

## 2017-04-24 ENCOUNTER — Encounter (HOSPITAL_COMMUNITY)
Admission: RE | Disposition: A | Discharge status: Home / Self Care | Payer: Self-pay | Source: Ambulatory Visit | Attending: Cardiology

## 2017-04-24 DIAGNOSIS — Z955 Presence of coronary angioplasty implant and graft: Secondary | ICD-10-CM | POA: Diagnosis not present

## 2017-04-24 DIAGNOSIS — Z8249 Family history of ischemic heart disease and other diseases of the circulatory system: Secondary | ICD-10-CM | POA: Diagnosis not present

## 2017-04-24 DIAGNOSIS — Z720 Tobacco use: Secondary | ICD-10-CM | POA: Insufficient documentation

## 2017-04-24 DIAGNOSIS — M1990 Unspecified osteoarthritis, unspecified site: Secondary | ICD-10-CM | POA: Diagnosis not present

## 2017-04-24 DIAGNOSIS — I10 Essential (primary) hypertension: Secondary | ICD-10-CM | POA: Diagnosis not present

## 2017-04-24 DIAGNOSIS — I739 Peripheral vascular disease, unspecified: Secondary | ICD-10-CM | POA: Diagnosis not present

## 2017-04-24 DIAGNOSIS — I252 Old myocardial infarction: Secondary | ICD-10-CM | POA: Diagnosis not present

## 2017-04-24 DIAGNOSIS — I209 Angina pectoris, unspecified: Secondary | ICD-10-CM | POA: Diagnosis present

## 2017-04-24 DIAGNOSIS — E785 Hyperlipidemia, unspecified: Secondary | ICD-10-CM | POA: Diagnosis not present

## 2017-04-24 DIAGNOSIS — F419 Anxiety disorder, unspecified: Secondary | ICD-10-CM | POA: Diagnosis not present

## 2017-04-24 DIAGNOSIS — I25110 Atherosclerotic heart disease of native coronary artery with unstable angina pectoris: Secondary | ICD-10-CM | POA: Diagnosis not present

## 2017-04-24 DIAGNOSIS — Z7982 Long term (current) use of aspirin: Secondary | ICD-10-CM | POA: Diagnosis not present

## 2017-04-24 DIAGNOSIS — I25119 Atherosclerotic heart disease of native coronary artery with unspecified angina pectoris: Secondary | ICD-10-CM | POA: Diagnosis not present

## 2017-04-24 HISTORY — PX: LEFT HEART CATH AND CORONARY ANGIOGRAPHY: CATH118249

## 2017-04-24 SURGERY — LEFT HEART CATH AND CORONARY ANGIOGRAPHY
Anesthesia: LOCAL

## 2017-04-24 MED ORDER — HEPARIN SODIUM (PORCINE) 1000 UNIT/ML IJ SOLN
INTRAMUSCULAR | Status: AC
  Filled 2017-04-24: qty 1

## 2017-04-24 MED ORDER — MIDAZOLAM HCL 2 MG/2ML IJ SOLN
INTRAMUSCULAR | Status: AC
  Filled 2017-04-24: qty 2

## 2017-04-24 MED ORDER — SODIUM CHLORIDE 0.9 % IV SOLN: INTRAVENOUS | Status: AC

## 2017-04-24 MED ORDER — NITROGLYCERIN 1 MG/10 ML FOR IR/CATH LAB
INTRA_ARTERIAL | Status: AC
  Filled 2017-04-24: qty 10

## 2017-04-24 MED ORDER — LIDOCAINE HCL (PF) 1 % IJ SOLN
INTRAMUSCULAR | Status: AC
  Filled 2017-04-24: qty 30

## 2017-04-24 MED ORDER — VERAPAMIL HCL 2.5 MG/ML IV SOLN
INTRAVENOUS | Status: AC
  Filled 2017-04-24: qty 2

## 2017-04-24 MED ORDER — VERAPAMIL HCL 2.5 MG/ML IV SOLN
INTRA_ARTERIAL | Status: DC | PRN
  Administered 2017-04-24: 10 mL via INTRA_ARTERIAL

## 2017-04-24 MED ORDER — SODIUM CHLORIDE 0.9 % IV SOLN: 250.0000 mL | INTRAVENOUS | Status: DC | PRN

## 2017-04-24 MED ORDER — SODIUM CHLORIDE 0.9% FLUSH: 3.0000 mL | Freq: Two times a day (BID) | INTRAVENOUS | Status: DC

## 2017-04-24 MED ORDER — IOPAMIDOL (ISOVUE-370) INJECTION 76%
INTRAVENOUS | Status: AC
  Filled 2017-04-24: qty 50

## 2017-04-24 MED ORDER — ACETAMINOPHEN 325 MG PO TABS: 650.0000 mg | ORAL_TABLET | ORAL | Status: DC | PRN

## 2017-04-24 MED ORDER — ASPIRIN 81 MG PO CHEW
CHEWABLE_TABLET | ORAL | Status: AC
  Filled 2017-04-24: qty 1

## 2017-04-24 MED ORDER — IOPAMIDOL (ISOVUE-370) INJECTION 76%
INTRAVENOUS | Status: DC | PRN
  Administered 2017-04-24: 120 mL via INTRA_ARTERIAL

## 2017-04-24 MED ORDER — FENTANYL CITRATE (PF) 100 MCG/2ML IJ SOLN
INTRAMUSCULAR | Status: AC
  Filled 2017-04-24: qty 2

## 2017-04-24 MED ORDER — SODIUM CHLORIDE 0.9% FLUSH: 3.0000 mL | INTRAVENOUS | Status: DC | PRN

## 2017-04-24 MED ORDER — CLOPIDOGREL BISULFATE 75 MG PO TABS
75.0000 mg | ORAL_TABLET | Freq: Once | ORAL | Status: AC
  Administered 2017-04-24: 75 mg via ORAL

## 2017-04-24 MED ORDER — ASPIRIN 81 MG PO CHEW: 81.0000 mg | CHEWABLE_TABLET | ORAL | Status: DC

## 2017-04-24 MED ORDER — LIDOCAINE HCL (PF) 1 % IJ SOLN
INTRAMUSCULAR | Status: DC | PRN
  Administered 2017-04-24: 20 mL
  Administered 2017-04-24: 10 mL
  Administered 2017-04-24: 2 mL
  Administered 2017-04-24: 10 mL

## 2017-04-24 MED ORDER — FENTANYL CITRATE (PF) 100 MCG/2ML IJ SOLN
INTRAMUSCULAR | Status: DC | PRN
  Administered 2017-04-24: 50 ug

## 2017-04-24 MED ORDER — HEPARIN (PORCINE) IN NACL 2-0.9 UNIT/ML-% IJ SOLN
INTRAMUSCULAR | Status: AC | PRN
  Administered 2017-04-24: 1000 mL

## 2017-04-24 MED ORDER — MIDAZOLAM HCL 2 MG/2ML IJ SOLN: INTRAMUSCULAR | Status: DC | PRN

## 2017-04-24 MED ORDER — CLOPIDOGREL BISULFATE 75 MG PO TABS
ORAL_TABLET | ORAL | Status: AC
  Filled 2017-04-24: qty 1

## 2017-04-24 MED ORDER — ONDANSETRON HCL 4 MG/2ML IJ SOLN: 4.0000 mg | Freq: Four times a day (QID) | INTRAMUSCULAR | Status: DC | PRN

## 2017-04-24 MED ORDER — IOPAMIDOL (ISOVUE-370) INJECTION 76%
INTRAVENOUS | Status: AC
  Filled 2017-04-24: qty 100

## 2017-04-24 MED ORDER — HEPARIN (PORCINE) IN NACL 2-0.9 UNIT/ML-% IJ SOLN
INTRAMUSCULAR | Status: AC
  Filled 2017-04-24: qty 1000

## 2017-04-24 MED ORDER — ASPIRIN 81 MG PO CHEW
81.0000 mg | CHEWABLE_TABLET | ORAL | Status: AC
  Administered 2017-04-24: 81 mg via ORAL

## 2017-04-24 MED ORDER — SODIUM CHLORIDE 0.9 % WEIGHT BASED INFUSION: 1.0000 mL/kg/h | INTRAVENOUS | Status: DC

## 2017-04-24 MED ORDER — SODIUM CHLORIDE 0.9 % WEIGHT BASED INFUSION
3.0000 mL/kg/h | INTRAVENOUS | Status: AC
  Administered 2017-04-24: 3 mL/kg/h via INTRAVENOUS

## 2017-04-24 SURGICAL SUPPLY — 19 items
CATH INFINITI 5 FR 3DRC (CATHETERS) ×1 IMPLANT
CATH INFINITI 5FR MULTPACK ANG (CATHETERS) ×1 IMPLANT
CATH OPTITORQUE TIG 4.0 5F (CATHETERS) ×2 IMPLANT
COVER PRB 48X5XTLSCP FOLD TPE (BAG) IMPLANT
COVER PROBE 5X48 (BAG) ×4
DEVICE RAD COMP TR BAND LRG (VASCULAR PRODUCTS) ×2 IMPLANT
GLIDESHEATH SLEND A-KIT 6F 20G (SHEATH) ×2 IMPLANT
GUIDEWIRE ANGLED .035X150CM (WIRE) ×2 IMPLANT
GUIDEWIRE INQWIRE 1.5J.035X260 (WIRE) IMPLANT
INQWIRE 1.5J .035X260CM (WIRE) ×2
KIT HEART LEFT (KITS) ×2 IMPLANT
KIT MICROINTRODUCER STIFF 5F (SHEATH) ×1 IMPLANT
PACK CARDIAC CATHETERIZATION (CUSTOM PROCEDURE TRAY) ×2 IMPLANT
SHEATH PINNACLE 5F 10CM (SHEATH) ×2 IMPLANT
SYR MEDRAD MARK V 150ML (SYRINGE) ×2 IMPLANT
TRANSDUCER W/STOPCOCK (MISCELLANEOUS) ×2 IMPLANT
TUBING CIL FLEX 10 FLL-RA (TUBING) ×1 IMPLANT
WIRE EMERALD 3MM-J .035X150CM (WIRE) ×4 IMPLANT
WIRE HI TORQ VERSACORE-J 145CM (WIRE) ×3 IMPLANT

## 2017-04-24 NOTE — Procedures (Signed)
Procedure performed: Ultrasound-guided access of the right radial artery right brachial arteriogram. 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  Ultrasound-guided access of the right renal artery was performed, however as the wire did not advance through the axillary artery, angiography was performed.  Ultrasound-guided access of the left common femoral artery using micropuncture technique. A 5 French sheath was placed.  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  Recommendation: Further evaluation and management is as per primary cardiology.

## 2017-04-24 NOTE — Progress Notes (Signed)
Site area: Right groin a 5 french arterial sheath was removed  Site Prior to Removal:  Level 0  Pressure Applied For 15 MINUTES    Bedrest Beginning at 1300p  Manual:   Yes.    Patient Status During Pull:  stable  Post Pull Groin Site:  Level 0  Post Pull Instructions Given:  Yes.    Post Pull Pulses Present:  Yes.    Dressing Applied:  Yes.    Comments:  VS remain stable during sheath pull

## 2017-04-24 NOTE — Interval H&P Note (Signed)
History and Physical Interval Note:  04/24/2017 8:48 AM  [name]  has presented today for surgery, with the diagnosis of chest pain  The various methods of treatment have been discussed with the patient and family. After consideration of risks, benefits and other options for treatment, the patient has consented to  Procedure(s): LEFT HEART CATH AND CORONARY ANGIOGRAPHY (N/A) as a surgical intervention .  The patient's history has been reviewed, patient examined, no change in status, stable for surgery.  I have reviewed the patient's chart and labs.  Questions were answered to the patient's satisfaction.     Charolette Forward

## 2017-04-24 NOTE — Progress Notes (Signed)
tSite area: Left groin a 5 french arterial sheath was removed  Site Prior to Removal:  Level 0  Pressure Applied For 15 MINUTES    Bedrest Beginning at 1300  Manual:   Yes.    Patient Status During Pull:  stable  Post Pull Groin Site:  Level 0  Post Pull Instructions Given:  Yes.    Post Pull Pulses Present:  Yes.    Dressing Applied:  Yes.    Comments:  VS remain stable

## 2017-04-24 NOTE — Interval H&P Note (Signed)
History and Physical Interval Note:  04/24/2017 8:49 AM  [name]  has presented today for surgery, with the diagnosis of chest pain  The various methods of treatment have been discussed with the patient and family. After consideration of risks, benefits and other options for treatment, the patient has consented to  Procedure(s): LEFT HEART CATH AND CORONARY ANGIOGRAPHY (N/A) as a surgical intervention .  The patient's history has been reviewed, patient examined, no change in status, stable for surgery.  I have reviewed the patient's chart and labs.  Questions were answered to the patient's satisfaction.     Charolette Forward

## 2017-04-24 NOTE — Discharge Instructions (Signed)
Femoral Site Care °Refer to this sheet in the next few weeks. These instructions provide you with information about caring for yourself after your procedure. Your health care provider may also give you more specific instructions. Your treatment has been planned according to current medical practices, but problems sometimes occur. Call your health care provider if you have any problems or questions after your procedure. °What can I expect after the procedure? °After your procedure, it is typical to have the following: °· Bruising at the site that usually fades within 1-2 weeks. °· Blood collecting in the tissue (hematoma) that may be painful to the touch. It should usually decrease in size and tenderness within 1-2 weeks. ° °Follow these instructions at home: °· Take medicines only as directed by your health care provider. °· You may shower 24-48 hours after the procedure or as directed by your health care provider. Remove the bandage (dressing) and gently wash the site with plain soap and water. Pat the area dry with a clean towel. Do not rub the site, because this may cause bleeding. °· Do not take baths, swim, or use a hot tub until your health care provider approves. °· Check your insertion site every day for redness, swelling, or drainage. °· Do not apply powder or lotion to the site. °· Limit use of stairs to twice a day for the first 2-3 days or as directed by your health care provider. °· Do not squat for the first 2-3 days or as directed by your health care provider. °· Do not lift over 10 lb (4.5 kg) for 5 days after your procedure or as directed by your health care provider. °· Ask your health care provider when it is okay to: °? Return to work or school. °? Resume usual physical activities or sports. °? Resume sexual activity. °· Do not drive home if you are discharged the same day as the procedure. Have someone else drive you. °· You may drive 24 hours after the procedure unless otherwise instructed by  your health care provider. °· Do not operate machinery or power tools for 24 hours after the procedure or as directed by your health care provider. °· If your procedure was done as an outpatient procedure, which means that you went home the same day as your procedure, a responsible adult should be with you for the first 24 hours after you arrive home. °· Keep all follow-up visits as directed by your health care provider. This is important. °Contact a health care provider if: °· You have a fever. °· You have chills. °· You have increased bleeding from the site. Hold pressure on the site. °Get help right away if: °· You have unusual pain at the site. °· You have redness, warmth, or swelling at the site. °· You have drainage (other than a small amount of blood on the dressing) from the site. °· The site is bleeding, and the bleeding does not stop after 30 minutes of holding steady pressure on the site. °· Your leg or foot becomes pale, cool, tingly, or numb. °This information is not intended to replace advice given to you by your health care provider. Make sure you discuss any questions you have with your health care provider. °Document Released: 02/06/2014 Document Revised: 11/11/2015 Document Reviewed: 12/23/2013 °Elsevier Interactive Patient Education © 2018 [employer] ° °Radial Site Care °Refer to this sheet in the next few weeks. These instructions provide you with information about caring for yourself after your procedure. Your health   care provider may also give you more specific instructions. Your treatment has been planned according to current medical practices, but problems sometimes occur. Call your health care provider if you have any problems or questions after your procedure. °What can I expect after the procedure? °After your procedure, it is typical to have the following: °· Bruising at the radial site that usually fades within 1-2 weeks. °· Blood collecting in the tissue (hematoma) that may be  painful to the touch. It should usually decrease in size and tenderness within 1-2 weeks. ° °Follow these instructions at home: °· Take medicines only as directed by your health care provider. °· You may shower 24-48 hours after the procedure or as directed by your health care provider. Remove the bandage (dressing) and gently wash the site with plain soap and water. Pat the area dry with a clean towel. Do not rub the site, because this may cause bleeding. °· Do not take baths, swim, or use a hot tub until your health care provider approves. °· Check your insertion site every day for redness, swelling, or drainage. °· Do not apply powder or lotion to the site. °· Do not flex or bend the affected arm for 24 hours or as directed by your health care provider. °· Do not push or pull heavy objects with the affected arm for 24 hours or as directed by your health care provider. °· Do not lift over 10 lb (4.5 kg) for 5 days after your procedure or as directed by your health care provider. °· Ask your health care provider when it is okay to: °? Return to work or school. °? Resume usual physical activities or sports. °? Resume sexual activity. °· Do not drive home if you are discharged the same day as the procedure. Have someone else drive you. °· You may drive 24 hours after the procedure unless otherwise instructed by your health care provider. °· Do not operate machinery or power tools for 24 hours after the procedure. °· If your procedure was done as an outpatient procedure, which means that you went home the same day as your procedure, a responsible adult should be with you for the first 24 hours after you arrive home. °· Keep all follow-up visits as directed by your health care provider. This is important. °Contact a health care provider if: °· You have a fever. °· You have chills. °· You have increased bleeding from the radial site. Hold pressure on the site. °Get help right away if: °· You have unusual pain at the  radial site. °· You have redness, warmth, or swelling at the radial site. °· You have drainage (other than a small amount of blood on the dressing) from the radial site. °· The radial site is bleeding, and the bleeding does not stop after 30 minutes of holding steady pressure on the site. °· Your arm or hand becomes pale, cool, tingly, or numb. °This information is not intended to replace advice given to you by your health care provider. Make sure you discuss any questions you have with your health care provider. °Document Released: 07/08/2010 Document Revised: 11/11/2015 Document Reviewed: 12/22/2013 °Elsevier Interactive Patient Education © 2018 [employer] ° ° °

## 2017-04-24 NOTE — H&P (Signed)
Printed H&P in the chart needs to be scanned 

## 2017-04-25 ENCOUNTER — Encounter (HOSPITAL_COMMUNITY): Payer: Self-pay | Admitting: Cardiology

## 2017-05-09 DIAGNOSIS — I10 Essential (primary) hypertension: Secondary | ICD-10-CM | POA: Diagnosis not present

## 2017-05-09 DIAGNOSIS — Z720 Tobacco use: Secondary | ICD-10-CM | POA: Diagnosis not present

## 2017-05-09 DIAGNOSIS — I25118 Atherosclerotic heart disease of native coronary artery with other forms of angina pectoris: Secondary | ICD-10-CM | POA: Diagnosis not present

## 2017-09-07 DIAGNOSIS — I25118 Atherosclerotic heart disease of native coronary artery with other forms of angina pectoris: Secondary | ICD-10-CM | POA: Diagnosis not present

## 2017-09-07 DIAGNOSIS — I10 Essential (primary) hypertension: Secondary | ICD-10-CM | POA: Diagnosis not present

## 2017-09-07 DIAGNOSIS — Z720 Tobacco use: Secondary | ICD-10-CM | POA: Diagnosis not present

## 2017-12-14 DIAGNOSIS — I25118 Atherosclerotic heart disease of native coronary artery with other forms of angina pectoris: Secondary | ICD-10-CM | POA: Diagnosis not present

## 2017-12-14 DIAGNOSIS — I252 Old myocardial infarction: Secondary | ICD-10-CM | POA: Diagnosis not present

## 2017-12-14 DIAGNOSIS — I10 Essential (primary) hypertension: Secondary | ICD-10-CM | POA: Diagnosis not present

## 2017-12-14 DIAGNOSIS — Z720 Tobacco use: Secondary | ICD-10-CM | POA: Diagnosis not present

## 2019-04-25 DIAGNOSIS — I25118 Atherosclerotic heart disease of native coronary artery with other forms of angina pectoris: Secondary | ICD-10-CM | POA: Diagnosis not present

## 2019-04-25 DIAGNOSIS — I10 Essential (primary) hypertension: Secondary | ICD-10-CM | POA: Diagnosis not present

## 2019-04-25 DIAGNOSIS — E785 Hyperlipidemia, unspecified: Secondary | ICD-10-CM | POA: Diagnosis not present

## 2019-04-25 DIAGNOSIS — I739 Peripheral vascular disease, unspecified: Secondary | ICD-10-CM | POA: Diagnosis not present

## 2020-08-27 DIAGNOSIS — E785 Hyperlipidemia, unspecified: Secondary | ICD-10-CM | POA: Diagnosis not present

## 2020-08-27 DIAGNOSIS — I2510 Atherosclerotic heart disease of native coronary artery without angina pectoris: Secondary | ICD-10-CM | POA: Diagnosis not present

## 2020-08-27 DIAGNOSIS — I10 Essential (primary) hypertension: Secondary | ICD-10-CM | POA: Diagnosis not present

## 2020-08-27 DIAGNOSIS — M1990 Unspecified osteoarthritis, unspecified site: Secondary | ICD-10-CM | POA: Diagnosis not present

## 2020-08-27 DIAGNOSIS — I739 Peripheral vascular disease, unspecified: Secondary | ICD-10-CM | POA: Diagnosis not present

## 2020-08-27 DIAGNOSIS — I25118 Atherosclerotic heart disease of native coronary artery with other forms of angina pectoris: Secondary | ICD-10-CM | POA: Diagnosis not present

## 2021-05-24 DIAGNOSIS — E785 Hyperlipidemia, unspecified: Secondary | ICD-10-CM | POA: Diagnosis not present

## 2021-05-24 DIAGNOSIS — F17290 Nicotine dependence, other tobacco product, uncomplicated: Secondary | ICD-10-CM | POA: Diagnosis not present

## 2021-05-24 DIAGNOSIS — I25118 Atherosclerotic heart disease of native coronary artery with other forms of angina pectoris: Secondary | ICD-10-CM | POA: Diagnosis not present

## 2021-05-24 DIAGNOSIS — I10 Essential (primary) hypertension: Secondary | ICD-10-CM | POA: Diagnosis not present

## 2022-03-03 DIAGNOSIS — F17290 Nicotine dependence, other tobacco product, uncomplicated: Secondary | ICD-10-CM | POA: Diagnosis not present

## 2022-03-03 DIAGNOSIS — I10 Essential (primary) hypertension: Secondary | ICD-10-CM | POA: Diagnosis not present

## 2022-03-03 DIAGNOSIS — E785 Hyperlipidemia, unspecified: Secondary | ICD-10-CM | POA: Diagnosis not present

## 2022-03-03 DIAGNOSIS — I25118 Atherosclerotic heart disease of native coronary artery with other forms of angina pectoris: Secondary | ICD-10-CM | POA: Diagnosis not present

## 2022-10-18 DEATH — deceased
